# Patient Record
Sex: Female | Born: 1995 | Race: White | Hispanic: No | Marital: Married | Smoking: Never smoker
Health system: Southern US, Community
[De-identification: ages and names within clinical notes are randomized; demographics above are authoritative.]

## PROBLEM LIST (undated history)

## (undated) ENCOUNTER — Inpatient Hospital Stay (HOSPITAL_COMMUNITY): Payer: Self-pay

## (undated) DIAGNOSIS — E0590 Thyrotoxicosis, unspecified without thyrotoxic crisis or storm: Secondary | ICD-10-CM

## (undated) DIAGNOSIS — I341 Nonrheumatic mitral (valve) prolapse: Secondary | ICD-10-CM

## (undated) DIAGNOSIS — M419 Scoliosis, unspecified: Secondary | ICD-10-CM

## (undated) DIAGNOSIS — K219 Gastro-esophageal reflux disease without esophagitis: Secondary | ICD-10-CM

## (undated) HISTORY — PX: TONSILLECTOMY: SUR1361

## (undated) HISTORY — DX: Endocrine, nutritional and metabolic diseases complicating pregnancy, third trimester: E05.90

---
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
## 2014-03-26 LAB — LIPID PANEL
Cholesterol: 192 (ref 0–200)
HDL: 79 — AB (ref 35–70)
LDL Cholesterol: 94
Triglycerides: 97 (ref 40–160)

## 2018-05-08 LAB — OB RESULTS CONSOLE HEPATITIS B SURFACE ANTIGEN: Hepatitis B Surface Ag: NEGATIVE

## 2018-05-08 LAB — OB RESULTS CONSOLE RPR: RPR: NONREACTIVE

## 2018-05-08 LAB — OB RESULTS CONSOLE ABO/RH: RH Type: POSITIVE

## 2018-05-08 LAB — OB RESULTS CONSOLE ANTIBODY SCREEN: Antibody Screen: NEGATIVE

## 2018-05-08 LAB — OB RESULTS CONSOLE GC/CHLAMYDIA
Chlamydia: NEGATIVE
Gonorrhea: NEGATIVE

## 2018-05-08 LAB — OB RESULTS CONSOLE RUBELLA ANTIBODY, IGM: Rubella: IMMUNE

## 2018-05-08 LAB — OB RESULTS CONSOLE HIV ANTIBODY (ROUTINE TESTING): HIV: NONREACTIVE

## 2018-07-05 NOTE — L&D Delivery Note (Signed)
Delivery Note Pt progressed to complete and pushed well.  At 10:56 PM a viable female was delivered via Vaginal, Spontaneous (Presentation: vtx; LOA ).  APGAR: 8, 9; weight pending.   Placenta status: spontaneous, intact.  Cord:  with the following complications: nuchal x 1 reduced.   Anesthesia:  Epidural, local Episiotomy: None Lacerations: 2nd degree;Perineal;Vaginal Suture Repair: 3.0 vicryl rapide Est. Blood Loss (mL):  875  Mom to postpartum.  Baby to Couplet care / Skin to Skin.  She had more bleeding than usual, responded to uterine massage and IM Methergine  Blane Ohara Aleese Kamps 12/04/2018, 11:39 PM

## 2018-07-13 DIAGNOSIS — Z3689 Encounter for other specified antenatal screening: Secondary | ICD-10-CM | POA: Diagnosis not present

## 2018-07-13 DIAGNOSIS — Z3A19 19 weeks gestation of pregnancy: Secondary | ICD-10-CM | POA: Diagnosis not present

## 2018-08-16 ENCOUNTER — Encounter (HOSPITAL_COMMUNITY): Payer: Self-pay | Admitting: *Deleted

## 2018-08-16 ENCOUNTER — Inpatient Hospital Stay (HOSPITAL_COMMUNITY)
Admission: AD | Admit: 2018-08-16 | Discharge: 2018-08-16 | Disposition: A | Discharge status: Home / Self Care | Payer: 59 | Attending: Obstetrics and Gynecology | Admitting: Obstetrics and Gynecology

## 2018-08-16 ENCOUNTER — Other Ambulatory Visit: Payer: Self-pay

## 2018-08-16 DIAGNOSIS — O368120 Decreased fetal movements, second trimester, not applicable or unspecified: Secondary | ICD-10-CM | POA: Diagnosis not present

## 2018-08-16 DIAGNOSIS — Z3A24 24 weeks gestation of pregnancy: Secondary | ICD-10-CM | POA: Insufficient documentation

## 2018-08-16 HISTORY — DX: Scoliosis, unspecified: M41.9

## 2018-08-16 HISTORY — DX: Nonrheumatic mitral (valve) prolapse: I34.1

## 2018-08-16 NOTE — Discharge Instructions (Signed)

## 2018-08-16 NOTE — MAU Note (Addendum)
Pt states she is feeling baby move now and she feels reassured. Pt has a appt on Friday.

## 2018-08-16 NOTE — MAU Note (Signed)
Pt presents to MAU c/o DFM pt reports no FM pt was unsure if she felt the baby move or not around 2200 but nothing since then. Pt drank sprite and layed on her side and still felt no movement. Pt denies bleeding or LOF. Pt reports pain up under her ribs on her right side but it comes and goes.

## 2018-08-16 NOTE — MAU Provider Note (Signed)
Chief Complaint:  Decreased Fetal Movement   First Provider Initiated Contact with Patient 08/16/18 0239      HPI: Melissa Ashley is a 23 y.o. at [redacted]w[redacted]d who presents to maternity admissions reporting no fetal movement since 2200hrs.  Has tried all the interventions and baby is still not felt moving.   She denies LOF, vaginal bleeding, vaginal itching/burning, urinary symptoms, h/a, dizziness, n/v, diarrhea, constipation or fever/chills.  .  RN Note: Pt presents to MAU c/o DFM pt reports no FM pt was unsure if she felt the baby move or not around 2200 but nothing since then. Pt drank sprite and layed on her side and still felt no movement. Pt denies bleeding or LOF. Pt reports pain up under her ribs on her right side but it comes and goes.   Past Medical History: No past medical history on file.  Past obstetric history: OB History  No obstetric history on file.    Past Surgical History: Noncontributory  Family History: No family history on file.  Social History: Social History   Tobacco Use  . Smoking status: Not on file  Substance Use Topics  . Alcohol use: Not on file  . Drug use: Not on file  Works at [employer]  Allergies: Allergies not on file  Meds:  No medications prior to admission.    I have reviewed patient's Past Medical Hx, Surgical Hx, Family Hx, Social Hx, medications and allergies.   ROS:  Review of Systems  Constitutional: Negative for chills and fever.  Respiratory: Negative for shortness of breath.   Gastrointestinal: Negative for abdominal pain.  Genitourinary: Negative for vaginal bleeding.   Other systems negative  Physical Exam  No data found. Constitutional: Well-developed, well-nourished female in no acute distress.  Cardiovascular: normal rate and rhythm Respiratory: normal effort, clear to auscultation bilaterally GI: Abd soft, non-tender, gravid appropriate for gestational age.   No rebound or guarding. MS: Extremities  nontender, no edema, normal ROM Neurologic: Alert and oriented x 4.  GU: Neg CVAT.  PELVIC EXAM: deferred  FHT:  Baseline 140 , moderate variability, small accelerations present, no decelerations, reassuring for fetal age    Contractions: none   Labs: No results found for this or any previous visit (from the past 24 hour(s)).     Imaging:  No results found.  MAU Course/MDM: NST reviewed  We monitored for an hour for reassurance. Patient reported she did feel fetal movement throughout monitoring period  Treatments in MAU included EFM.    Assessment: Single intrauterine pregnancy at [redacted]w[redacted]d Decreased fetal movement Reassuring fetal monitoring tracing  Plan: Discharge home Preterm Labor precautions and fetal kick counts Follow up in Office for prenatal visits and recheck of status  Encouraged to return here or to other Urgent Care/ED if she develops worsening of symptoms, increase in pain, fever, or other concerning symptoms.   Pt stable at time of discharge.  Hansel Feinstein CNM, MSN Certified Nurse-Midwife 08/16/2018 2:39 AM

## 2018-08-18 DIAGNOSIS — O4402 Placenta previa specified as without hemorrhage, second trimester: Secondary | ICD-10-CM | POA: Diagnosis not present

## 2018-08-18 DIAGNOSIS — Z3A24 24 weeks gestation of pregnancy: Secondary | ICD-10-CM | POA: Diagnosis not present

## 2018-08-18 DIAGNOSIS — Z362 Encounter for other antenatal screening follow-up: Secondary | ICD-10-CM | POA: Diagnosis not present

## 2018-09-08 DIAGNOSIS — Z3689 Encounter for other specified antenatal screening: Secondary | ICD-10-CM | POA: Diagnosis not present

## 2018-09-08 DIAGNOSIS — Z3A27 27 weeks gestation of pregnancy: Secondary | ICD-10-CM | POA: Diagnosis not present

## 2018-09-08 DIAGNOSIS — Z23 Encounter for immunization: Secondary | ICD-10-CM | POA: Diagnosis not present

## 2018-09-08 DIAGNOSIS — Z3402 Encounter for supervision of normal first pregnancy, second trimester: Secondary | ICD-10-CM | POA: Diagnosis not present

## 2018-10-24 ENCOUNTER — Encounter (HOSPITAL_COMMUNITY): Payer: Self-pay | Admitting: *Deleted

## 2018-10-24 ENCOUNTER — Other Ambulatory Visit: Payer: Self-pay

## 2018-10-24 ENCOUNTER — Inpatient Hospital Stay (HOSPITAL_COMMUNITY)
Admission: AD | Admit: 2018-10-24 | Discharge: 2018-10-24 | Disposition: A | Discharge status: Home / Self Care | Payer: 59 | Attending: Obstetrics and Gynecology | Admitting: Obstetrics and Gynecology

## 2018-10-24 DIAGNOSIS — O26893 Other specified pregnancy related conditions, third trimester: Secondary | ICD-10-CM | POA: Insufficient documentation

## 2018-10-24 DIAGNOSIS — I341 Nonrheumatic mitral (valve) prolapse: Secondary | ICD-10-CM | POA: Insufficient documentation

## 2018-10-24 DIAGNOSIS — O9989 Other specified diseases and conditions complicating pregnancy, childbirth and the puerperium: Secondary | ICD-10-CM | POA: Diagnosis not present

## 2018-10-24 DIAGNOSIS — H43393 Other vitreous opacities, bilateral: Secondary | ICD-10-CM

## 2018-10-24 DIAGNOSIS — R51 Headache: Secondary | ICD-10-CM | POA: Diagnosis not present

## 2018-10-24 DIAGNOSIS — Z79899 Other long term (current) drug therapy: Secondary | ICD-10-CM | POA: Insufficient documentation

## 2018-10-24 DIAGNOSIS — R519 Headache, unspecified: Secondary | ICD-10-CM

## 2018-10-24 DIAGNOSIS — M419 Scoliosis, unspecified: Secondary | ICD-10-CM | POA: Diagnosis not present

## 2018-10-24 DIAGNOSIS — Z3A34 34 weeks gestation of pregnancy: Secondary | ICD-10-CM | POA: Insufficient documentation

## 2018-10-24 DIAGNOSIS — Z882 Allergy status to sulfonamides status: Secondary | ICD-10-CM | POA: Diagnosis not present

## 2018-10-24 DIAGNOSIS — R0989 Other specified symptoms and signs involving the circulatory and respiratory systems: Secondary | ICD-10-CM | POA: Diagnosis not present

## 2018-10-24 DIAGNOSIS — R002 Palpitations: Secondary | ICD-10-CM

## 2018-10-24 LAB — COMPREHENSIVE METABOLIC PANEL
ALT: 12 U/L (ref 0–44)
AST: 15 U/L (ref 15–41)
Albumin: 2.5 g/dL — ABNORMAL LOW (ref 3.5–5.0)
Alkaline Phosphatase: 102 U/L (ref 38–126)
Anion gap: 9 (ref 5–15)
BUN: 6 mg/dL (ref 6–20)
CO2: 21 mmol/L — ABNORMAL LOW (ref 22–32)
Calcium: 8.5 mg/dL — ABNORMAL LOW (ref 8.9–10.3)
Chloride: 106 mmol/L (ref 98–111)
Creatinine, Ser: 0.49 mg/dL (ref 0.44–1.00)
GFR calc Af Amer: >60 mL/min (ref >60)
GFR calc non Af Amer: >60 mL/min (ref >60)
Glucose, Bld: 108 mg/dL — ABNORMAL HIGH (ref 70–99)
Potassium: 3 mmol/L — ABNORMAL LOW (ref 3.5–5.1)
Sodium: 136 mmol/L (ref 135–145)
Total Bilirubin: 0.8 mg/dL (ref 0.3–1.2)
Total Protein: 5.7 g/dL — ABNORMAL LOW (ref 6.5–8.1)

## 2018-10-24 LAB — URINALYSIS, ROUTINE W REFLEX MICROSCOPIC
Bilirubin Urine: NEGATIVE
Glucose, UA: NEGATIVE mg/dL
Hgb urine dipstick: NEGATIVE
Ketones, ur: 80 mg/dL — AB
Leukocytes,Ua: NEGATIVE
Nitrite: NEGATIVE
Protein, ur: NEGATIVE mg/dL
Specific Gravity, Urine: 1.01 (ref 1.005–1.030)
pH: 7 (ref 5.0–8.0)

## 2018-10-24 LAB — PROTEIN / CREATININE RATIO, URINE
Creatinine, Urine: 58.32 mg/dL
Protein Creatinine Ratio: 0.19 mg/mg{Cre} — ABNORMAL HIGH (ref 0.00–0.15)
Total Protein, Urine: 11 mg/dL

## 2018-10-24 LAB — CBC
HCT: 32.2 % — ABNORMAL LOW (ref 36.0–46.0)
Hemoglobin: 11.1 g/dL — ABNORMAL LOW (ref 12.0–15.0)
MCH: 30.2 pg (ref 26.0–34.0)
MCHC: 34.5 g/dL (ref 30.0–36.0)
MCV: 87.7 fL (ref 80.0–100.0)
Platelets: 228 10*3/uL (ref 150–400)
RBC: 3.67 MIL/uL — ABNORMAL LOW (ref 3.87–5.11)
RDW: 13.6 % (ref 11.5–15.5)
WBC: 14.7 10*3/uL — ABNORMAL HIGH (ref 4.0–10.5)
nRBC: 0 % (ref 0.0–0.2)

## 2018-10-24 MED ORDER — BUTALBITAL-APAP-CAFFEINE 50-325-40 MG PO CAPS: 1.0000 | ORAL_CAPSULE | Freq: Four times a day (QID) | ORAL | 3 refills | Status: DC | PRN

## 2018-10-24 NOTE — Discharge Instructions (Signed)
Third Trimester of Pregnancy The third trimester is from week 28 through week 40 (months 7 through 9). The third trimester is a time when the unborn baby (fetus) is growing rapidly. At the end of the ninth month, the fetus is about 20 inches in length and weighs 6-10 pounds. Body changes during your third trimester Your body will continue to go through many changes during pregnancy. The changes vary from woman to woman. During the third trimester:  Your weight will continue to increase. You can expect to gain 25-35 pounds (11-16 kg) by the end of the pregnancy.  You may begin to get stretch marks on your hips, abdomen, and breasts.  You may urinate more often because the fetus is moving lower into your pelvis and pressing on your bladder.  You may develop or continue to have heartburn. This is caused by increased hormones that slow down muscles in the digestive tract.  You may develop or continue to have constipation because increased hormones slow digestion and cause the muscles that push waste through your intestines to relax.  You may develop hemorrhoids. These are swollen veins (varicose veins) in the rectum that can itch or be painful.  You may develop swollen, bulging veins (varicose veins) in your legs.  You may have increased body aches in the pelvis, back, or thighs. This is due to weight gain and increased hormones that are relaxing your joints.  You may have changes in your hair. These can include thickening of your hair, rapid growth, and changes in texture. Some women also have hair loss during or after pregnancy, or hair that feels dry or thin. Your hair will most likely return to normal after your baby is born.  Your breasts will continue to grow and they will continue to become tender. A yellow fluid (colostrum) may leak from your breasts. This is the first milk you are producing for your baby.  Your belly button may stick out.  You may notice more swelling in your hands,  face, or ankles.  You may have increased tingling or numbness in your hands, arms, and legs. The skin on your belly may also feel numb.  You may feel short of breath because of your expanding uterus.  You may have more problems sleeping. This can be caused by the size of your belly, increased need to urinate, and an increase in your body's metabolism.  You may notice the fetus "dropping," or moving lower in your abdomen (lightening).  You may have increased vaginal discharge.  You may notice your joints feel loose and you may have pain around your pelvic bone. What to expect at prenatal visits You will have prenatal exams every 2 weeks until week 36. Then you will have weekly prenatal exams. During a routine prenatal visit:  You will be weighed to make sure you and the baby are growing normally.  Your blood pressure will be taken.  Your abdomen will be measured to track your baby's growth.  The fetal heartbeat will be listened to.  Any test results from the previous visit will be discussed.  You may have a cervical check near your due date to see if your cervix has softened or thinned (effaced).  You will be tested for Group B streptococcus. This happens between 35 and 37 weeks. Your health care provider may ask you:  What your birth plan is.  How you are feeling.  If you are feeling the baby move.  If you have had any abnormal  symptoms, such as leaking fluid, bleeding, severe headaches, or abdominal cramping.  If you are using any tobacco products, including cigarettes, chewing tobacco, and electronic cigarettes.  If you have any questions. Other tests or screenings that may be performed during your third trimester include:  Blood tests that check for low iron levels (anemia).  Fetal testing to check the health, activity level, and growth of the fetus. Testing is done if you have certain medical conditions or if there are problems during the pregnancy.  Nonstress test  (NST). This test checks the health of your baby to make sure there are no signs of problems, such as the baby not getting enough oxygen. During this test, a belt is placed around your belly. The baby is made to move, and its heart rate is monitored during movement. What is false labor? False labor is a condition in which you feel small, irregular tightenings of the muscles in the womb (contractions) that usually go away with rest, changing position, or drinking water. These are called Braxton Hicks contractions. Contractions may last for hours, days, or even weeks before true labor sets in. If contractions come at regular intervals, become more frequent, increase in intensity, or become painful, you should see your health care provider. What are the signs of labor?  Abdominal cramps.  Regular contractions that start at 10 minutes apart and become stronger and more frequent with time.  Contractions that start on the top of the uterus and spread down to the lower abdomen and back.  Increased pelvic pressure and dull back pain.  A watery or bloody mucus discharge that comes from the vagina.  Leaking of amniotic fluid. This is also known as your "water breaking." It could be a slow trickle or a gush. Let your health care provider know if it has a color or strange odor. If you have any of these signs, call your health care provider right away, even if it is before your due date. Follow these instructions at home: Medicines  Follow your health care provider's instructions regarding medicine use. Specific medicines may be either safe or unsafe to take during pregnancy.  Take a prenatal vitamin that contains at least 600 micrograms (mcg) of folic acid.  If you develop constipation, try taking a stool softener if your health care provider approves. Eating and drinking   Eat a balanced diet that includes fresh fruits and vegetables, whole grains, good sources of protein such as meat, eggs, or tofu,  and low-fat dairy. Your health care provider will help you determine the amount of weight gain that is right for you.  Avoid raw meat and uncooked cheese. These carry germs that can cause birth defects in the baby.  If you have low calcium intake from food, talk to your health care provider about whether you should take a daily calcium supplement.  Eat four or five small meals rather than three large meals a day.  Limit foods that are high in fat and processed sugars, such as fried and sweet foods.  To prevent constipation: ? Drink enough fluid to keep your urine clear or pale yellow. ? Eat foods that are high in fiber, such as fresh fruits and vegetables, whole grains, and beans. Activity  Exercise only as directed by your health care provider. Most women can continue their usual exercise routine during pregnancy. Try to exercise for 30 minutes at least 5 days a week. Stop exercising if you experience uterine contractions.  Avoid heavy lifting.  Do  not exercise in extreme heat or humidity, or at high altitudes.  Wear low-heel, comfortable shoes.  Practice good posture.  You may continue to have sex unless your health care provider tells you otherwise. Relieving pain and discomfort  Take frequent breaks and rest with your legs elevated if you have leg cramps or low back pain.  Take warm sitz baths to soothe any pain or discomfort caused by hemorrhoids. Use hemorrhoid cream if your health care provider approves.  Wear a good support bra to prevent discomfort from breast tenderness.  If you develop varicose veins: ? Wear support pantyhose or compression stockings as told by your healthcare provider. ? Elevate your feet for 15 minutes, 3-4 times a day. Prenatal care  Write down your questions. Take them to your prenatal visits.  Keep all your prenatal visits as told by your health care provider. This is important. Safety  Wear your seat belt at all times when driving.  Make  a list of emergency phone numbers, including numbers for family, friends, the hospital, and police and fire departments. General instructions  Avoid cat litter boxes and soil used by cats. These carry germs that can cause birth defects in the baby. If you have a cat, ask someone to clean the litter box for you.  Do not travel far distances unless it is absolutely necessary and only with the approval of your health care provider.  Do not use hot tubs, steam rooms, or saunas.  Do not drink alcohol.  Do not use any products that contain nicotine or tobacco, such as cigarettes and e-cigarettes. If you need help quitting, ask your health care provider.  Do not use any medicinal herbs or unprescribed drugs. These chemicals affect the formation and growth of the baby.  Do not douche or use tampons or scented sanitary pads.  Do not cross your legs for long periods of time.  To prepare for the arrival of your baby: ? Take prenatal classes to understand, practice, and ask questions about labor and delivery. ? Make a trial run to the hospital. ? Visit the hospital and tour the maternity area. ? Arrange for maternity or paternity leave through employers. ? Arrange for family and friends to take care of pets while you are in the hospital. ? Purchase a rear-facing car seat and make sure you know how to install it in your car. ? Pack your hospital bag. ? Prepare the babys nursery. Make sure to remove all pillows and stuffed animals from the baby's crib to prevent suffocation.  Visit your dentist if you have not gone during your pregnancy. Use a soft toothbrush to brush your teeth and be gentle when you floss. Contact a health care provider if:  You are unsure if you are in labor or if your water has broken.  You become dizzy.  You have mild pelvic cramps, pelvic pressure, or nagging pain in your abdominal area.  You have lower back pain.  You have persistent nausea, vomiting, or  diarrhea.  You have an unusual or bad smelling vaginal discharge.  You have pain when you urinate. Get help right away if:  Your water breaks before 37 weeks.  You have regular contractions less than 5 minutes apart before 37 weeks.  You have a fever.  You are leaking fluid from your vagina.  You have spotting or bleeding from your vagina.  You have severe abdominal pain or cramping.  You have rapid weight loss or weight gain.  You have  shortness of breath with chest pain.  You notice sudden or extreme swelling of your face, hands, ankles, feet, or legs.  Your baby makes fewer than 10 movements in 2 hours.  You have severe headaches that do not go away when you take medicine.  You have vision changes. Summary  The third trimester is from week 28 through week 40, months 7 through 9. The third trimester is a time when the unborn baby (fetus) is growing rapidly.  During the third trimester, your discomfort may increase as you and your baby continue to gain weight. You may have abdominal, leg, and back pain, sleeping problems, and an increased need to urinate.  During the third trimester your breasts will keep growing and they will continue to become tender. A yellow fluid (colostrum) may leak from your breasts. This is the first milk you are producing for your baby.  False labor is a condition in which you feel small, irregular tightenings of the muscles in the womb (contractions) that eventually go away. These are called Braxton Hicks contractions. Contractions may last for hours, days, or even weeks before true labor sets in.  Signs of labor can include: abdominal cramps; regular contractions that start at 10 minutes apart and become stronger and more frequent with time; watery or bloody mucus discharge that comes from the vagina; increased pelvic pressure and dull back pain; and leaking of amniotic fluid. This information is not intended to replace advice given to you by your  health care provider. Make sure you discuss any questions you have with your health care provider. Document Released: 06/15/2001 Document Revised: 07/27/2016 Document Reviewed: 07/27/2016 Elsevier Interactive Patient Education  2019 [place] Headache Without Cause A headache is pain or discomfort that is felt around the head or neck area. There are many causes and types of headaches. In some cases, the cause may not be found. Follow these instructions at home: Watch your condition for any changes. Let your doctor know about them. Take these steps to help with your condition: Managing pain      Take over-the-counter and prescription medicines only as told by your doctor.  Lie down in a dark, quiet room when you have a headache.  If told, put ice on your head and neck area: ? Put ice in a plastic bag. ? Place a towel between your skin and the bag. ? Leave the ice on for 20 minutes, 2-3 times per day.  If told, put heat on the affected area. Use the heat source that your doctor recommends, such as a moist heat pack or a heating pad. ? Place a towel between your skin and the heat source. ? Leave the heat on for 20-30 minutes. ? Remove the heat if your skin turns bright red. This is very important if you are unable to feel pain, heat, or cold. You may have a greater risk of getting burned.  Keep lights dim if bright lights bother you or make your headaches worse. Eating and drinking  Eat meals on a regular schedule.  If you drink alcohol: ? Limit how much you use to:  0-1 drink a day for women.  0-2 drinks a day for men. ? Be aware of how much alcohol is in your drink. In the [place] one drink equals one 12 oz bottle of beer (355 mL), one 5 oz glass of wine (148 mL), or one 1 oz glass of hard liquor (44 mL).  Stop drinking caffeine, or reduce how  much caffeine you drink. General instructions   Keep a journal to find out if certain things bring on headaches. For  example, write down: ? What you eat and drink. ? How much sleep you get. ? Any change to your diet or medicines.  Get a massage or try other ways to relax.  Limit stress.  Sit up straight. Do not tighten (tense) your muscles.  Do not use any products that contain nicotine or tobacco. This includes cigarettes, e-cigarettes, and chewing tobacco. If you need help quitting, ask your doctor.  Exercise regularly as told by your doctor.  Get enough sleep. This often means 7-9 hours of sleep each night.  Keep all follow-up visits as told by your doctor. This is important. Contact a doctor if:  Your symptoms are not helped by medicine.  You have a headache that feels different than the other headaches.  You feel sick to your stomach (nauseous) or you throw up (vomit).  You have a fever. Get help right away if:  Your headache gets very bad quickly.  Your headache gets worse after a lot of physical activity.  You keep throwing up.  You have a stiff neck.  You have trouble seeing.  You have trouble speaking.  You have pain in the eye or ear.  Your muscles are weak or you lose muscle control.  You lose your balance or have trouble walking.  You feel like you will pass out (faint) or you pass out.  You are mixed up (confused).  You have a seizure. Summary  A headache is pain or discomfort that is felt around the head or neck area.  There are many causes and types of headaches. In some cases, the cause may not be found.  Keep a journal to help find out what causes your headaches. Watch your condition for any changes. Let your doctor know about them.  Contact a doctor if you have a headache that is different from usual, or if your headache is not helped by medicine.  Get help right away if your headache gets very bad, you throw up, you have trouble seeing, you lose your balance, or you have a seizure. This information is not intended to replace advice given to you by  your health care provider. Make sure you discuss any questions you have with your health care provider. Document Released: 03/30/2008 Document Revised: 01/09/2018 Document Reviewed: 01/09/2018 Elsevier Interactive Patient Education  2019 [employer] Sinus Tachycardia  Sinus tachycardia is a kind of fast heartbeat. In sinus tachycardia, the heart beats more than 100 times a minute. Sinus tachycardia starts in a part of the heart called the sinus node. Sinus tachycardia may be harmless, or it may be a sign of a serious condition. What are the causes? This condition may be caused by:  Exercise or exertion.  A fever.  Pain.  Loss of body fluids (dehydration).  Severe bleeding (hemorrhage).  Anxiety and stress.  Certain substances, including: ? Alcohol. ? Caffeine. ? Tobacco and nicotine products. ? Cold medicines. ? Illegal drugs.  Medical conditions including: ? Heart disease. ? An infection. ? An overactive thyroid (hyperthyroidism). ? A lack of red blood cells (anemia). What are the signs or symptoms? Symptoms of this condition include:  A feeling that the heart is beating quickly (palpitations).  Suddenly noticing your heartbeat (cardiac awareness).  Dizziness.  Tiredness (fatigue).  Shortness of breath.  Chest pain.  Nausea.  Fainting. How is this diagnosed? This condition is diagnosed  with:  A physical exam.  Other tests, such as: ? Blood tests. ? An electrocardiogram (ECG). This test measures the electrical activity of the heart. ? Ambulatory cardiac monitor. This records your heartbeats for 24 hours or more. You may be referred to a heart specialist (cardiologist). How is this treated? Treatment for this condition depends on the cause or the underlying condition. Treatment may involve:  Treating the underlying condition.  Taking new medicines or changing your current medicines as told by your health care provider.  Making changes to your diet  or lifestyle. Follow these instructions at home: Lifestyle   Do not use any products that contain nicotine or tobacco, such as cigarettes and e-cigarettes. If you need help quitting, ask your health care provider.  Do not use illegal drugs, such as cocaine.  Learn relaxation methods to help you when you get stressed or anxious. These include deep breathing.  Avoid caffeine or other stimulants. Alcohol use   Do not drink alcohol if: ? Your health care provider tells you not to drink. ? You are pregnant, may be pregnant, or are planning to become pregnant.  If you drink alcohol, limit how much you have: ? 0-1 drink a day for women. ? 0-2 drinks a day for men.  Be aware of how much alcohol is in your drink. In the [place] one drink equals one typical bottle of beer (12 oz), one-half glass of wine (5 oz), or one shot of hard liquor (1 oz). General instructions  Drink enough fluids to keep your urine pale yellow.  Take over-the-counter and prescription medicines only as told by your health care provider.  Keep all follow-up visits as told by your health care provider. This is important. Contact a health care provider if you have:  A fever.  Vomiting or diarrhea that does not go away. Get help right away if you:  Have pain in your chest, upper arms, jaw, or neck.  Become weak or dizzy.  Feel faint.  Have palpitations that do not go away. Summary  In sinus tachycardia, the heart beats more than 100 times a minute.  Sinus tachycardia may be harmless, or it may be a sign of a serious condition.  Treatment for this condition depends on the cause or the underlying condition.  Get help right away if you have pain in your chest, upper arms, jaw, or neck. This information is not intended to replace advice given to you by your health care provider. Make sure you discuss any questions you have with your health care provider. Document Released: 07/29/2004 Document Revised:  08/10/2017 Document Reviewed: 08/10/2017 Elsevier Interactive Patient Education  2019 [employer].

## 2018-10-24 NOTE — MAU Note (Addendum)
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   HAS CARDIAC HX- HAS LITTLE HOLE IN HEART.    H/A STARTED AT  3PM- TIRED- NOT SLEEPING GOOD-   CALLED DR Terri Piedra AT 6 PM- TOOK TYLENOL- 650 MG  AND ATE.  NOW HER HEAD FEELS WORSE AND BP WAS SAME AT HOME..  IN LOBBY - SAW FLOATERS.       IN OFFICE - BP WAS  OK.  DENIES EPIGASTRIC PAIN .

## 2018-10-24 NOTE — MAU Provider Note (Addendum)
Chief Complaint:  Headache   First Provider Initiated Contact with Patient 10/24/18 2007      HPI: Melissa Ashley is a 23 y.o. G1P0 at 65w0dwho presents to maternity admissions reporting "heart beating out of my chest" at home (better now), elevated BP on home cuff, and intermittent feelings of SOB.  No cough or fever.  Intermittently sees floaters.   Has MV Prolapse and "hole in my heart" but has not seen a cardiologist in 2 years.. She reports good fetal movement, denies LOF, vaginal bleeding, vaginal itching/burning, urinary symptoms, h/a, dizziness, n/v, diarrhea, constipation or fever/chills.  She denies  RUQ abdominal pain.  RN note: PT SAYS WAS GETTING READY FOR WORK- HERE ON MOTHER  BABY- RN - CALLED OUT TONIGHT  -  WAS SOB AND INCREASE HR- SO SAT ON  SOFA  AND TOOK BP - 152/88.   HAS CARDIAC HX- HAS LITTLE HOLE IN HEART.    H/A STARTED AT  3PM- TIRED- NOT SLEEPING GOOD-   CALLED DR Terri Piedra AT 6 PM- TOOK TYLENOL- 650 MG  AND ATE.  NOW HER HEAD FEELS WORSE AND BP WAS SAME AT HOME..  IN LOBBY - SAW FLOATERS.       IN OFFICE - BP WAS  OK.  DENIES EPIGASTRIC PAIN .  Past Medical History: Past Medical History:  Diagnosis Date  . Mitral valve prolapse    asymptomatic  . Scoliosis     Past obstetric history: OB History  Gravida Para Term Preterm AB Living  1            SAB TAB Ectopic Multiple Live Births               # Outcome Date GA Lbr Len/2nd Weight Sex Delivery Anes PTL Lv  1 Current             Past Surgical History: Past Surgical History:  Procedure Laterality Date  . TONSILLECTOMY      Family History: Family History  Problem Relation Age of Onset  . Hypertension Father   . Cancer Maternal Grandmother     Social History: Social History   Tobacco Use  . Smoking status: Never Smoker  Substance Use Topics  . Alcohol use: Never    Frequency: Never  . Drug use: Never    Allergies:  Allergies  Allergen Reactions  . Neomycin   . Shellfish Allergy   . Sulfa  Antibiotics Swelling    Meds:  Medications Prior to Admission  Medication Sig Dispense Refill Last Dose  . docusate sodium (COLACE) 100 MG capsule Take 100 mg by mouth 2 (two) times daily.   10/23/2018 at Unknown time  . Prenatal Vit-Fe Fumarate-FA (PRENATAL MULTIVITAMIN) TABS tablet Take 1 tablet by mouth daily at 12 noon.   10/23/2018 at Unknown time    I have reviewed patient's Past Medical Hx, Surgical Hx, Family Hx, Social Hx, medications and allergies.   ROS:  Review of Systems  Constitutional: Negative for chills and fever.  HENT: Negative for sore throat.   Eyes: Positive for visual disturbance (seeing floaters at times).  Respiratory: Positive for shortness of breath (had one episode, none now). Negative for cough and wheezing.   Cardiovascular: Negative for chest pain and leg swelling.  Gastrointestinal: Negative for abdominal pain, constipation, diarrhea, nausea and vomiting.  Genitourinary: Negative for vaginal bleeding.  Neurological: Negative for weakness.   Other systems negative  Physical Exam   Patient Vitals for the past 24 hrs:  BP  Temp Temp src Pulse Resp Height Weight  10/24/18 1947 136/80 98.5 F (36.9 C) Oral (!) 108 20 5\' 4"  (1.626 m) 86.8 kg   Vitals:   10/24/18 2045 10/24/18 2100 10/24/18 2115 10/24/18 2301  BP: 113/67 117/63 115/68 122/78  Pulse: 99 92 (!) 101 88  Resp:    17  Temp:      TempSrc:      Weight:      Height:        Constitutional: Well-developed, well-nourished female in no acute distress.  Cardiovascular: normal rate and rhythm, no tachycardia Respiratory: normal effort, clear to auscultation bilaterally GI: Abd soft, non-tender, gravid appropriate for gestational age.   MS: Extremities nontender, Trace pedal edema, normal ROM Neurologic: Alert and oriented x 4.  DTRs 1+ with no clonus GU: Neg CVAT.  PELVIC EXAM:  deferred  FHT:  Baseline 140 , moderate variability, accelerations present, no decelerations Contractions:   Rare   Labs: Results for orders placed or performed during the hospital encounter of 10/24/18 (from the past 24 hour(s))  Urinalysis, Routine w reflex microscopic     Status: Abnormal   Collection Time: 10/24/18  7:56 PM  Result Value Ref Range   Color, Urine YELLOW YELLOW   APPearance CLEAR CLEAR   Specific Gravity, Urine 1.010 1.005 - 1.030   pH 7.0 5.0 - 8.0   Glucose, UA NEGATIVE NEGATIVE mg/dL   Hgb urine dipstick NEGATIVE NEGATIVE   Bilirubin Urine NEGATIVE NEGATIVE   Ketones, ur 80 (A) NEGATIVE mg/dL   Protein, ur NEGATIVE NEGATIVE mg/dL   Nitrite NEGATIVE NEGATIVE   Leukocytes,Ua NEGATIVE NEGATIVE  CBC     Status: Abnormal   Collection Time: 10/24/18  8:19 PM  Result Value Ref Range   WBC 14.7 (H) 4.0 - 10.5 K/uL   RBC 3.67 (L) 3.87 - 5.11 MIL/uL   Hemoglobin 11.1 (L) 12.0 - 15.0 g/dL   HCT 32.2 (L) 36.0 - 46.0 %   MCV 87.7 80.0 - 100.0 fL   MCH 30.2 26.0 - 34.0 pg   MCHC 34.5 30.0 - 36.0 g/dL   RDW 13.6 11.5 - 15.5 %   Platelets 228 150 - 400 K/uL   nRBC 0.0 0.0 - 0.2 %  Comprehensive metabolic panel     Status: Abnormal   Collection Time: 10/24/18  8:19 PM  Result Value Ref Range   Sodium 136 135 - 145 mmol/L   Potassium 3.0 (L) 3.5 - 5.1 mmol/L   Chloride 106 98 - 111 mmol/L   CO2 21 (L) 22 - 32 mmol/L   Glucose, Bld 108 (H) 70 - 99 mg/dL   BUN 6 6 - 20 mg/dL   Creatinine, Ser 0.49 0.44 - 1.00 mg/dL   Calcium 8.5 (L) 8.9 - 10.3 mg/dL   Total Protein 5.7 (L) 6.5 - 8.1 g/dL   Albumin 2.5 (L) 3.5 - 5.0 g/dL   AST 15 15 - 41 U/L   ALT 12 0 - 44 U/L   Alkaline Phosphatase 102 38 - 126 U/L   Total Bilirubin 0.8 0.3 - 1.2 mg/dL   GFR calc non Af Amer >60 >60 mL/min   GFR calc Af Amer >60 >60 mL/min   Anion gap 9 5 - 15  Protein / creatinine ratio, urine     Status: Abnormal   Collection Time: 10/24/18  8:25 PM  Result Value Ref Range   Creatinine, Urine 58.32 mg/dL   Total Protein, Urine 11 mg/dL   Protein Creatinine Ratio  0.19 (H) 0.00 - 0.15 mg/mg[Cre]       Imaging:  12 lead EKG done Read by Dr Nehemiah Settle NSR with machine interpreted LA enlargement and LVH WNL per Dr Nehemiah Settle  MAU Course/MDM: I have ordered labs and reviewed results. No evidence of preeclampsia Blood pressures all within normal limits Pt currently not experiencing pounding heart rate or SOB.  States headache is "not bad".  Declines medicine for headache NST reviewed and is reactive, Category I Will order EKG >> see above  Consult Dr Nehemiah Settle with presentation, exam findings and test results.   Assessment: Single intrauterine pregnancy at [redacted]w[redacted]d Headache, improved Reported elevation in BP at home, all normal here Normal preeclampsia labs History of MVP and ?VSD, normal EKG  Plan: Discharge home Rx Fioricet for home use for headaches if needed Discussed blood pressure readings at home may not be accurate, need to calibrate  Discussed history of heart conditions. May want to consult cardiologist for evaluation Labor precautions and fetal kick counts Follow up in Office (scheduled tomorrow) for prenatal visits and recheck  Encouraged to return here or to other Urgent Care/ED if she develops worsening of symptoms, increase in pain, fever, or other concerning symptoms.   Pt stable at time of discharge.  Hansel Feinstein CNM, MSN Certified Nurse-Midwife 10/24/2018 8:07 PM

## 2018-11-03 DIAGNOSIS — Z3A35 35 weeks gestation of pregnancy: Secondary | ICD-10-CM | POA: Diagnosis not present

## 2018-11-03 DIAGNOSIS — O133 Gestational [pregnancy-induced] hypertension without significant proteinuria, third trimester: Secondary | ICD-10-CM | POA: Diagnosis not present

## 2018-11-10 DIAGNOSIS — Z3403 Encounter for supervision of normal first pregnancy, third trimester: Secondary | ICD-10-CM | POA: Diagnosis not present

## 2018-11-10 DIAGNOSIS — Z3A36 36 weeks gestation of pregnancy: Secondary | ICD-10-CM | POA: Diagnosis not present

## 2018-11-10 DIAGNOSIS — Z113 Encounter for screening for infections with a predominantly sexual mode of transmission: Secondary | ICD-10-CM | POA: Diagnosis not present

## 2018-11-10 DIAGNOSIS — Z3685 Encounter for antenatal screening for Streptococcus B: Secondary | ICD-10-CM | POA: Diagnosis not present

## 2018-11-10 LAB — OB RESULTS CONSOLE GBS: GBS: NEGATIVE

## 2018-11-23 ENCOUNTER — Inpatient Hospital Stay (HOSPITAL_COMMUNITY)
Admission: AD | Admit: 2018-11-23 | Discharge: 2018-11-24 | Disposition: A | Discharge status: Home / Self Care | Payer: 59 | Attending: Obstetrics and Gynecology | Admitting: Obstetrics and Gynecology

## 2018-11-23 ENCOUNTER — Other Ambulatory Visit: Payer: Self-pay

## 2018-11-23 ENCOUNTER — Encounter (HOSPITAL_COMMUNITY): Payer: Self-pay | Admitting: *Deleted

## 2018-11-23 ENCOUNTER — Telehealth (HOSPITAL_COMMUNITY): Payer: Self-pay | Admitting: *Deleted

## 2018-11-23 DIAGNOSIS — W1830XA Fall on same level, unspecified, initial encounter: Secondary | ICD-10-CM

## 2018-11-23 DIAGNOSIS — Z8249 Family history of ischemic heart disease and other diseases of the circulatory system: Secondary | ICD-10-CM | POA: Diagnosis not present

## 2018-11-23 DIAGNOSIS — O9989 Other specified diseases and conditions complicating pregnancy, childbirth and the puerperium: Secondary | ICD-10-CM | POA: Diagnosis not present

## 2018-11-23 DIAGNOSIS — Z3689 Encounter for other specified antenatal screening: Secondary | ICD-10-CM | POA: Diagnosis not present

## 2018-11-23 DIAGNOSIS — W109XXA Fall (on) (from) unspecified stairs and steps, initial encounter: Secondary | ICD-10-CM | POA: Insufficient documentation

## 2018-11-23 DIAGNOSIS — Z882 Allergy status to sulfonamides status: Secondary | ICD-10-CM | POA: Insufficient documentation

## 2018-11-23 DIAGNOSIS — O9A213 Injury, poisoning and certain other consequences of external causes complicating pregnancy, third trimester: Secondary | ICD-10-CM

## 2018-11-23 DIAGNOSIS — Z91013 Allergy to seafood: Secondary | ICD-10-CM | POA: Insufficient documentation

## 2018-11-23 DIAGNOSIS — Z3A38 38 weeks gestation of pregnancy: Secondary | ICD-10-CM | POA: Insufficient documentation

## 2018-11-23 DIAGNOSIS — M7918 Myalgia, other site: Secondary | ICD-10-CM | POA: Diagnosis not present

## 2018-11-23 NOTE — MAU Provider Note (Addendum)
Patient Melissa Ashley is a 23 y.o. G1P0 At [redacted]w[redacted]d here after a fall this morning at 11:45 after she slipped at the bottom of her stairs. She denies VB, decreased fetal movments, LOF. She denies dysuria, vaginal discharge, floating spots, blurry vision, headache and RUQ. She denies contractions.   She has had uncomplicated pregnancy although patient reports she has had occasionally high blood pressure during her pregnancy. She has been getting her blood pressure checked twice a week at the office. They are "keeping a close eye on it". She is scheduled to be induced at 39 weeks 6 days (June 1).  She had an ob-gyn appt this morning with Dr. Willis Modena.  History     CSN: [id number]  Arrival date and time: 11/23/18 1307   First Provider Initiated Contact with Patient 11/23/18 1426      Chief Complaint  Patient presents with  . Fall   Fall  The accident occurred 1 to 3 hours ago. The fall occurred while walking. She fell from a height of 3 to 5 ft. She landed on carpet. There was no blood loss. The point of impact was the buttocks. The pain is present in the buttocks.  She did not hit her head or her abdomen.   OB History    Gravida  1   Para      Term      Preterm      AB      Living        SAB      TAB      Ectopic      Multiple      Live Births              Past Medical History:  Diagnosis Date  . Mitral valve prolapse    asymptomatic  . Scoliosis     Past Surgical History:  Procedure Laterality Date  . TONSILLECTOMY     hemorrhaged    Family History  Problem Relation Age of Onset  . Hypertension Father   . Cancer Maternal Grandmother     Social History   Tobacco Use  . Smoking status: Never Smoker  . Smokeless tobacco: Never Used  Substance Use Topics  . Alcohol use: Never    Frequency: Never  . Drug use: Never    Allergies:  Allergies  Allergen Reactions  . Neomycin   . Shellfish Allergy   . Sulfa Antibiotics Swelling     Medications Prior to Admission  Medication Sig Dispense Refill Last Dose  . docusate sodium (COLACE) 100 MG capsule Take 100 mg by mouth 2 (two) times daily.   Past Week at Unknown time  . Prenatal Vit-Fe Fumarate-FA (PRENATAL MULTIVITAMIN) TABS tablet Take 1 tablet by mouth daily at 12 noon.   11/23/2018 at Unknown time  . Butalbital-APAP-Caffeine 50-325-40 MG capsule Take 1-2 capsules by mouth every 6 (six) hours as needed for headache. 30 capsule 3     Review of Systems  Constitutional: Negative.   HENT: Negative.   Respiratory: Negative.   Cardiovascular: Negative.   Gastrointestinal: Negative.   Genitourinary: Negative.   Musculoskeletal:       Pain on her left buttock from where she landed  Neurological: Negative.   Psychiatric/Behavioral: Negative.    Physical Exam   Blood pressure 132/79, pulse (!) 103, temperature 98.4 F (36.9 C), resp. rate 18, height 5\' 4"  (1.626 m), weight 87.5 kg, last menstrual period 02/28/2018.  Physical Exam  Constitutional: She is  oriented to person, place, and time. She appears well-developed.  HENT:  Head: Normocephalic.  Neck: Normal range of motion.  GI: Soft.  Musculoskeletal: Normal range of motion.  Neurological: She is alert and oriented to person, place, and time.  Skin: Skin is warm and dry.  Psychiatric: She has a normal mood and affect.    MAU Course  Procedures  MDM -will monitor for 4 hours status post fall (until 3:45) -patient feels strong fetal movements while on NST -NST: 145 bpm, mod var, present acel, neg decels, no contractions.  -No bleeding, discharge or other complaints.    Assessment and Plan   1. NST (non-stress test) reactive    2. Patient stable for discharge; reviewed warning signs and when to return to the MAU.   3. Keep follow up appt next week at ob-gyn.   4. All questions answered.    Mervyn Skeeters Kooistra 11/23/2018, 2:32 PM

## 2018-11-23 NOTE — Discharge Instructions (Signed)

## 2018-11-23 NOTE — Telephone Encounter (Signed)
Preadmission screen  

## 2018-11-23 NOTE — MAU Note (Signed)
Pt stated at 11:45 she was going down stairs and missed the last 2 and fell on her back an bottom. Does not think she hit her abd. Good fetal movement since the fall. Reports some soreness in her left hip/lower back.

## 2018-11-23 NOTE — MAU Note (Signed)
Urine in lab 

## 2018-11-25 ENCOUNTER — Other Ambulatory Visit: Payer: Self-pay

## 2018-11-25 ENCOUNTER — Encounter (HOSPITAL_COMMUNITY): Payer: Self-pay

## 2018-11-25 ENCOUNTER — Inpatient Hospital Stay (HOSPITAL_COMMUNITY)
Admission: AD | Admit: 2018-11-25 | Discharge: 2018-11-25 | Disposition: A | Discharge status: Home / Self Care | Payer: 59 | Attending: Obstetrics and Gynecology | Admitting: Obstetrics and Gynecology

## 2018-11-25 DIAGNOSIS — Z3A38 38 weeks gestation of pregnancy: Secondary | ICD-10-CM | POA: Diagnosis not present

## 2018-11-25 DIAGNOSIS — Z809 Family history of malignant neoplasm, unspecified: Secondary | ICD-10-CM | POA: Insufficient documentation

## 2018-11-25 DIAGNOSIS — Z91013 Allergy to seafood: Secondary | ICD-10-CM | POA: Diagnosis not present

## 2018-11-25 DIAGNOSIS — I341 Nonrheumatic mitral (valve) prolapse: Secondary | ICD-10-CM | POA: Insufficient documentation

## 2018-11-25 DIAGNOSIS — Z8249 Family history of ischemic heart disease and other diseases of the circulatory system: Secondary | ICD-10-CM | POA: Insufficient documentation

## 2018-11-25 DIAGNOSIS — O368190 Decreased fetal movements, unspecified trimester, not applicable or unspecified: Secondary | ICD-10-CM | POA: Insufficient documentation

## 2018-11-25 DIAGNOSIS — Z882 Allergy status to sulfonamides status: Secondary | ICD-10-CM | POA: Diagnosis not present

## 2018-11-25 DIAGNOSIS — M419 Scoliosis, unspecified: Secondary | ICD-10-CM | POA: Diagnosis not present

## 2018-11-25 DIAGNOSIS — O368130 Decreased fetal movements, third trimester, not applicable or unspecified: Secondary | ICD-10-CM | POA: Diagnosis not present

## 2018-11-25 DIAGNOSIS — O9989 Other specified diseases and conditions complicating pregnancy, childbirth and the puerperium: Secondary | ICD-10-CM | POA: Insufficient documentation

## 2018-11-25 DIAGNOSIS — Z3689 Encounter for other specified antenatal screening: Secondary | ICD-10-CM | POA: Insufficient documentation

## 2018-11-25 DIAGNOSIS — Z881 Allergy status to other antibiotic agents status: Secondary | ICD-10-CM | POA: Diagnosis not present

## 2018-11-25 NOTE — MAU Note (Signed)
Melissa Ashley is a 23 y.o. at [redacted]w[redacted]d here in MAU reporting: pt states she fell on Thursday, was feeling normal movement until today. Has felt 3-4 movement since 0930. Having irregular cramping, no bleeding, no LOF  Onset of complaint: today  Pain score: 0/10  Vitals:   11/25/18 1233  BP: 130/78  Pulse: 89  Resp: 18  Temp: 98.5 F (36.9 C)  SpO2: 99%     FHT:160  Lab orders placed from triage: none

## 2018-11-25 NOTE — Discharge Instructions (Signed)

## 2018-11-25 NOTE — MAU Provider Note (Addendum)
Patient  Melissa Ashley is a 23 y.o. G1P0 At [redacted]w[redacted]d here with complaints of decreased fetal movements. She denies LOF, VB, HA, NV, abdominal pain, fever, chills, SOB.   Patient had a fall oon 5-21 for a fall down her stairs; she had a reactive NST and was evaluated and discharged from the MAU.   She receives [facility] at [facility]; she is scheduled for induction on June 1.    History     CSN: [id number]  Arrival date and time: 11/25/18 1222   First Provider Initiated Contact with Patient 11/25/18 1303      Chief Complaint  Patient presents with  . Decreased Fetal Movement   HPI Patient states that her baby moved "normal" on Friday and a lot on Friday night. When she woke up today at 9:30, she said that she felt like the baby was moving less. She tried drinking juice and lying down. She tried "poking" her belly and see if the baby would respond; usually if she jiggles her belly the baby will kick but this time she did not. Patient says that she was "trying to give the baby time to move" but then she got too worried and so she called Dr. Melba Coon at 11:30. Dr. Melba Coon advised patient to come to MAU.   OB History    Gravida  1   Para      Term      Preterm      AB      Living        SAB      TAB      Ectopic      Multiple      Live Births              Past Medical History:  Diagnosis Date  . Mitral valve prolapse    asymptomatic  . Scoliosis     Past Surgical History:  Procedure Laterality Date  . TONSILLECTOMY     hemorrhaged    Family History  Problem Relation Age of Onset  . Hypertension Father   . Cancer Maternal Grandmother     Social History   Tobacco Use  . Smoking status: Never Smoker  . Smokeless tobacco: Never Used  Substance Use Topics  . Alcohol use: Never    Frequency: Never  . Drug use: Never    Allergies:  Allergies  Allergen Reactions  . Neomycin   . Shellfish Allergy   . Sulfa Antibiotics Swelling     Medications Prior to Admission  Medication Sig Dispense Refill Last Dose  . Butalbital-APAP-Caffeine 50-325-40 MG capsule Take 1-2 capsules by mouth every 6 (six) hours as needed for headache. 30 capsule 3   . docusate sodium (COLACE) 100 MG capsule Take 100 mg by mouth 2 (two) times daily.   Past Week at Unknown time  . Prenatal Vit-Fe Fumarate-FA (PRENATAL MULTIVITAMIN) TABS tablet Take 1 tablet by mouth daily at 12 noon.   11/23/2018 at Unknown time    Review of Systems  Constitutional: Negative.   HENT: Negative.   Respiratory: Negative.   Cardiovascular: Negative.   Gastrointestinal: Negative.   Genitourinary: Negative.   Musculoskeletal: Negative.    Physical Exam   Blood pressure 130/78, pulse 89, temperature 98.5 F (36.9 C), temperature source Oral, resp. rate 18, height 5\' 4"  (1.626 m), weight 87.4 kg, last menstrual period 02/28/2018, SpO2 99 %.  Physical Exam  Constitutional: She appears well-developed and well-nourished.  HENT:  Head: Normocephalic.  Neck: Normal range of motion.  GI: Soft.  Neurological: She is alert.  Skin: Skin is warm.    MAU Course  Procedures  MDM -NST: 130 bpm, mod var, present acel, neg decels,  -Patient felt strong fetal movements while in MAU; denies pain, bleeding, LOS or other ob-gyn complaint at this time.  -Patient reassured by NST.   Patient Vitals for the past 24 hrs:  BP Temp Temp src Pulse Resp SpO2 Height Weight  11/25/18 1233 130/78 98.5 F (36.9 C) Oral 89 18 99 % - -  11/25/18 1229 - - - - - - 5\' 4"  (1.626 m) 87.4 kg     Assessment and Plan   1. NST (non-stress test) reactive    2. Patient stable for discharge with review of fetal kick counts.   3. Reviewed warning signs and when to return to MAU  4. Plan to keep follow up appt at [facility] this week.   5. All questions answered.  Mervyn Skeeters Kooistra 11/25/2018, 1:06 PM

## 2018-11-30 ENCOUNTER — Other Ambulatory Visit (HOSPITAL_COMMUNITY)
Admission: AD | Admit: 2018-11-30 | Discharge: 2018-11-30 | Disposition: A | Discharge status: Home / Self Care | Payer: 59 | Source: Ambulatory Visit | Attending: Obstetrics and Gynecology | Admitting: Obstetrics and Gynecology

## 2018-11-30 ENCOUNTER — Other Ambulatory Visit: Payer: Self-pay

## 2018-11-30 DIAGNOSIS — Z1159 Encounter for screening for other viral diseases: Secondary | ICD-10-CM | POA: Insufficient documentation

## 2018-12-01 ENCOUNTER — Other Ambulatory Visit (HOSPITAL_COMMUNITY): Payer: Self-pay | Admitting: *Deleted

## 2018-12-01 LAB — NOVEL CORONAVIRUS, NAA (HOSP ORDER, SEND-OUT TO REF LAB; TAT 18-24 HRS): SARS-CoV-2, NAA: NOT DETECTED

## 2018-12-04 ENCOUNTER — Other Ambulatory Visit: Payer: Self-pay

## 2018-12-04 ENCOUNTER — Inpatient Hospital Stay (HOSPITAL_COMMUNITY)
Admission: RE | Admit: 2018-12-04 | Discharge: 2018-12-06 | DRG: 805 | Disposition: A | Discharge status: Home / Self Care | Payer: 59 | Attending: Obstetrics and Gynecology | Admitting: Obstetrics and Gynecology

## 2018-12-04 ENCOUNTER — Inpatient Hospital Stay (HOSPITAL_COMMUNITY): Payer: 59 | Admitting: Anesthesiology

## 2018-12-04 ENCOUNTER — Encounter (HOSPITAL_COMMUNITY): Payer: Self-pay | Admitting: *Deleted

## 2018-12-04 ENCOUNTER — Inpatient Hospital Stay (HOSPITAL_COMMUNITY): Payer: 59

## 2018-12-04 DIAGNOSIS — O26893 Other specified pregnancy related conditions, third trimester: Secondary | ICD-10-CM | POA: Diagnosis present

## 2018-12-04 DIAGNOSIS — I341 Nonrheumatic mitral (valve) prolapse: Secondary | ICD-10-CM | POA: Diagnosis present

## 2018-12-04 DIAGNOSIS — Z3A39 39 weeks gestation of pregnancy: Secondary | ICD-10-CM | POA: Diagnosis not present

## 2018-12-04 DIAGNOSIS — D649 Anemia, unspecified: Secondary | ICD-10-CM | POA: Diagnosis not present

## 2018-12-04 DIAGNOSIS — O9942 Diseases of the circulatory system complicating childbirth: Secondary | ICD-10-CM | POA: Diagnosis present

## 2018-12-04 DIAGNOSIS — O9902 Anemia complicating childbirth: Secondary | ICD-10-CM | POA: Diagnosis present

## 2018-12-04 LAB — CBC
HCT: 34.9 % — ABNORMAL LOW (ref 36.0–46.0)
Hemoglobin: 11.7 g/dL — ABNORMAL LOW (ref 12.0–15.0)
MCH: 29.6 pg (ref 26.0–34.0)
MCHC: 33.5 g/dL (ref 30.0–36.0)
MCV: 88.4 fL (ref 80.0–100.0)
Platelets: 187 10*3/uL (ref 150–400)
RBC: 3.95 MIL/uL (ref 3.87–5.11)
RDW: 13.8 % (ref 11.5–15.5)
WBC: 11.5 10*3/uL — ABNORMAL HIGH (ref 4.0–10.5)
nRBC: 0 % (ref 0.0–0.2)

## 2018-12-04 LAB — TYPE AND SCREEN
ABO/RH(D): A POS
Antibody Screen: NEGATIVE

## 2018-12-04 LAB — ABO/RH: ABO/RH(D): A POS

## 2018-12-04 LAB — RPR: RPR Ser Ql: NONREACTIVE

## 2018-12-04 MED ORDER — SOD CITRATE-CITRIC ACID 500-334 MG/5ML PO SOLN: 30.0000 mL | ORAL | Status: DC | PRN

## 2018-12-04 MED ORDER — LACTATED RINGERS IV SOLN
INTRAVENOUS | Status: DC
  Administered 2018-12-04 (×3): via INTRAVENOUS

## 2018-12-04 MED ORDER — SODIUM CHLORIDE (PF) 0.9 % IJ SOLN
INTRAMUSCULAR | Status: DC | PRN
  Administered 2018-12-04: 12 mL/h via EPIDURAL

## 2018-12-04 MED ORDER — OXYTOCIN BOLUS FROM INFUSION
500.0000 mL | Freq: Once | INTRAVENOUS | Status: AC
  Administered 2018-12-04: 500 mL via INTRAVENOUS

## 2018-12-04 MED ORDER — LACTATED RINGERS IV SOLN: 500.0000 mL | Freq: Once | INTRAVENOUS | Status: DC

## 2018-12-04 MED ORDER — ONDANSETRON HCL 4 MG/2ML IJ SOLN
4.0000 mg | Freq: Four times a day (QID) | INTRAMUSCULAR | Status: DC | PRN
  Administered 2018-12-04: 4 mg via INTRAVENOUS
  Filled 2018-12-04: qty 2

## 2018-12-04 MED ORDER — FENTANYL-BUPIVACAINE-NACL 0.5-0.125-0.9 MG/250ML-% EP SOLN
12.0000 mL/h | EPIDURAL | Status: DC | PRN
  Filled 2018-12-04: qty 250

## 2018-12-04 MED ORDER — EPHEDRINE 5 MG/ML INJ: 10.0000 mg | INTRAVENOUS | Status: DC | PRN

## 2018-12-04 MED ORDER — OXYTOCIN 40 UNITS IN NORMAL SALINE INFUSION - SIMPLE MED
1.0000 m[IU]/min | INTRAVENOUS | Status: DC
  Administered 2018-12-04: 2 m[IU]/min via INTRAVENOUS
  Filled 2018-12-04: qty 1000

## 2018-12-04 MED ORDER — PHENYLEPHRINE 40 MCG/ML (10ML) SYRINGE FOR IV PUSH (FOR BLOOD PRESSURE SUPPORT): 80.0000 ug | PREFILLED_SYRINGE | INTRAVENOUS | Status: DC | PRN

## 2018-12-04 MED ORDER — DIPHENHYDRAMINE HCL 50 MG/ML IJ SOLN: 12.5000 mg | INTRAMUSCULAR | Status: DC | PRN

## 2018-12-04 MED ORDER — OXYCODONE-ACETAMINOPHEN 5-325 MG PO TABS: 2.0000 | ORAL_TABLET | ORAL | Status: DC | PRN

## 2018-12-04 MED ORDER — TERBUTALINE SULFATE 1 MG/ML IJ SOLN: 0.2500 mg | Freq: Once | INTRAMUSCULAR | Status: DC | PRN

## 2018-12-04 MED ORDER — METHYLERGONOVINE MALEATE 0.2 MG/ML IJ SOLN
INTRAMUSCULAR | Status: AC
  Administered 2018-12-04: 0.2 mg via INTRAMUSCULAR
  Filled 2018-12-04: qty 1

## 2018-12-04 MED ORDER — LIDOCAINE HCL (PF) 1 % IJ SOLN
INTRAMUSCULAR | Status: DC | PRN
  Administered 2018-12-04: 6 mL via EPIDURAL
  Administered 2018-12-04: 5 mL via EPIDURAL

## 2018-12-04 MED ORDER — LIDOCAINE HCL (PF) 1 % IJ SOLN
30.0000 mL | INTRAMUSCULAR | Status: AC | PRN
  Administered 2018-12-04: 30 mL via SUBCUTANEOUS
  Filled 2018-12-04: qty 30

## 2018-12-04 MED ORDER — OXYCODONE-ACETAMINOPHEN 5-325 MG PO TABS: 1.0000 | ORAL_TABLET | ORAL | Status: DC | PRN

## 2018-12-04 MED ORDER — LACTATED RINGERS IV SOLN: 500.0000 mL | INTRAVENOUS | Status: DC | PRN

## 2018-12-04 MED ORDER — ACETAMINOPHEN 325 MG PO TABS: 650.0000 mg | ORAL_TABLET | ORAL | Status: DC | PRN

## 2018-12-04 MED ORDER — METHYLERGONOVINE MALEATE 0.2 MG/ML IJ SOLN
0.2000 mg | Freq: Once | INTRAMUSCULAR | Status: AC
  Administered 2018-12-04: 0.2 mg via INTRAMUSCULAR

## 2018-12-04 MED ORDER — OXYTOCIN 40 UNITS IN NORMAL SALINE INFUSION - SIMPLE MED: 2.5000 [IU]/h | INTRAVENOUS | Status: DC

## 2018-12-04 MED ORDER — BUTORPHANOL TARTRATE 1 MG/ML IJ SOLN: 1.0000 mg | INTRAMUSCULAR | Status: DC | PRN

## 2018-12-04 NOTE — H&P (Signed)
Melissa Ashley is a 23 y.o. female, G1P0, EGA 39+ weeks with EDC 6-2 presenting for elective induction.  Prenatal care complicated only by some borderline elevated BP with normal labs.  OB History    Gravida  1   Para      Term      Preterm      AB      Living        SAB      TAB      Ectopic      Multiple      Live Births             Past Medical History:  Diagnosis Date  . Mitral valve prolapse    asymptomatic  . Scoliosis    pt has xray pic from few years ago   Past Surgical History:  Procedure Laterality Date  . TONSILLECTOMY     hemorrhaged   Family History: family history includes Cancer in her maternal grandmother; Hypertension in her father. Social History:  reports that she has never smoked. She has never used smokeless tobacco. She reports that she does not drink alcohol or use drugs.     Maternal Diabetes: No Genetic Screening: Normal Maternal Ultrasounds/Referrals: Normal Fetal Ultrasounds or other Referrals:  None Maternal Substance Abuse:  No Significant Maternal Medications:  None Significant Maternal Lab Results:  Lab values include: Group B Strep negative Other Comments:  None  Review of Systems  Respiratory: Negative.   Cardiovascular: Negative.    Maternal Medical History:  Contractions: Perceived severity is mild.    Fetal activity: Perceived fetal activity is normal.    Prenatal complications: no prenatal complications Prenatal Complications - Diabetes: none.    Dilation: 1.5 Effacement (%): Thick Station: -3 Exam by:: J.Cox, RN Blood pressure (!) 130/92, pulse 95, temperature 98.4 F (36.9 C), temperature source Oral, resp. rate 18, height 5\' 4"  (1.626 m), weight 89.4 kg, last menstrual period 02/28/2018. Maternal Exam:  Uterine Assessment: Contraction strength is mild.  Contraction frequency is rare.   Abdomen: Patient reports no abdominal tenderness. Estimated fetal weight is 7 lbs.   Fetal presentation:  vertex  Introitus: Normal vulva. Normal vagina.  Amniotic fluid character: not assessed.  Pelvis: adequate for delivery.   Cervix: Cervix evaluated by digital exam.     Fetal Exam Fetal Monitor Review: Mode: ultrasound.   Baseline rate: 130s.  Variability: moderate (6-25 bpm).   Pattern: accelerations present and no decelerations.    Fetal State Assessment: Category I - tracings are normal.     Physical Exam  Vitals reviewed. Constitutional: She appears well-developed and well-nourished.  Cardiovascular: Normal rate and regular rhythm.  Respiratory: Effort normal. No respiratory distress.  GI: Soft.  Genitourinary:    Vulva normal.     Prenatal labs: ABO, Rh: A/Positive/-- (11/04 0000) Antibody: Negative (11/04 0000) Rubella: Immune (11/04 0000) RPR: Nonreactive (11/04 0000)  HBsAg: Negative (11/04 0000)  HIV: Non-reactive (11/04 0000)  GBS: Negative (05/08 0000)   Assessment/Plan: IUP at 39+ weeks for elective induction.  Will start pitocin, monitor progress, anticipate SVD   Blane Ohara Melissa Ashley 12/04/2018, 8:23 AM

## 2018-12-04 NOTE — Anesthesia Procedure Notes (Signed)
Epidural Patient location during procedure: OB Start time: 12/04/2018 3:20 PM End time: 12/04/2018 3:25 PM  Staffing Anesthesiologist: Lyn Hollingshead, MD Performed: anesthesiologist   Preanesthetic Checklist Completed: patient identified, site marked, surgical consent, pre-op evaluation, timeout performed, IV checked, risks and benefits discussed and monitors and equipment checked  Epidural Patient position: sitting Prep: site prepped and draped and DuraPrep Patient monitoring: continuous pulse ox and blood pressure Approach: midline Location: L3-L4 Injection technique: LOR air  Needle:  Needle type: Tuohy  Needle gauge: 17 G Needle length: 9 cm and 9 Needle insertion depth: 5 cm cm Catheter type: closed end flexible Catheter size: 19 Gauge Catheter at skin depth: 10 cm Test dose: negative and Other  Assessment Sensory level: T9 Events: blood not aspirated, injection not painful, no injection resistance, negative IV test and no paresthesia  Additional Notes Reason for block:procedure for pain

## 2018-12-04 NOTE — Progress Notes (Signed)
Feeling some ctx Afeb, VSS FHT- 130s, Cat I, ctx q 2-4 min VE-2-3/50/-2, vtx, AROM clear Continue pitocin, monitor progress

## 2018-12-04 NOTE — Anesthesia Preprocedure Evaluation (Signed)
Anesthesia Evaluation  Patient identified by MRN, date of birth, ID band Patient awake    Reviewed: Allergy & Precautions, H&P , NPO status , Patient's Chart, lab work & pertinent test results  Airway Mallampati: II  TM Distance: >3 FB Neck ROM: full    Dental no notable dental hx.    Pulmonary neg pulmonary ROS,    Pulmonary exam normal breath sounds clear to auscultation       Cardiovascular negative cardio ROS   Rhythm:regular Rate:Normal     Neuro/Psych negative neurological ROS  negative psych ROS   GI/Hepatic negative GI ROS, Neg liver ROS,   Endo/Other  negative endocrine ROS  Renal/GU negative Renal ROS  negative genitourinary   Musculoskeletal negative musculoskeletal ROS (+)   Abdominal (+) + obese,   Peds  Hematology  (+) Blood dyscrasia, anemia ,   Anesthesia Other Findings   Reproductive/Obstetrics (+) Pregnancy                             Anesthesia Physical Anesthesia Plan  ASA: II  Anesthesia Plan: Epidural   Post-op Pain Management:    Induction:   PONV Risk Score and Plan:   Airway Management Planned:   Additional Equipment:   Intra-op Plan:   Post-operative Plan:   Informed Consent: I have reviewed the patients History and Physical, chart, labs and discussed the procedure including the risks, benefits and alternatives for the proposed anesthesia with the patient or authorized representative who has indicated his/her understanding and acceptance.       Plan Discussed with:   Anesthesia Plan Comments:         Anesthesia Quick Evaluation

## 2018-12-04 NOTE — Progress Notes (Signed)
Comfortable with epidural Afeb, VSS FHT- 120s, mod variability, some early and some variable decels, + accels, Cat II, adequate ctx VE-6/90/0, vtx Continue pitocin, monitor progress, anticipate SVD

## 2018-12-04 NOTE — Progress Notes (Signed)
Comfortable with epidural Afeb, VSS FHT- 120-130, mod variaiblity, early and some variable decels, + accels, + scalp stim, Cat II, ctx q 2-4 min VE-4/70/-1, vtx, IUPC placed Continue pitocin, monitor progress

## 2018-12-05 ENCOUNTER — Encounter (HOSPITAL_COMMUNITY): Payer: Self-pay

## 2018-12-05 LAB — CBC
HCT: 31.3 % — ABNORMAL LOW (ref 36.0–46.0)
Hemoglobin: 10.6 g/dL — ABNORMAL LOW (ref 12.0–15.0)
MCH: 29.9 pg (ref 26.0–34.0)
MCHC: 33.9 g/dL (ref 30.0–36.0)
MCV: 88.2 fL (ref 80.0–100.0)
Platelets: 193 10*3/uL (ref 150–400)
RBC: 3.55 MIL/uL — ABNORMAL LOW (ref 3.87–5.11)
RDW: 13.6 % (ref 11.5–15.5)
WBC: 17.6 10*3/uL — ABNORMAL HIGH (ref 4.0–10.5)
nRBC: 0 % (ref 0.0–0.2)

## 2018-12-05 MED ORDER — OXYCODONE HCL 5 MG PO TABS: 10.0000 mg | ORAL_TABLET | ORAL | Status: DC | PRN

## 2018-12-05 MED ORDER — SIMETHICONE 80 MG PO CHEW: 80.0000 mg | CHEWABLE_TABLET | ORAL | Status: DC | PRN

## 2018-12-05 MED ORDER — METHYLERGONOVINE MALEATE 0.2 MG PO TABS: 0.2000 mg | ORAL_TABLET | ORAL | Status: DC | PRN

## 2018-12-05 MED ORDER — DIPHENHYDRAMINE HCL 25 MG PO CAPS: 25.0000 mg | ORAL_CAPSULE | Freq: Four times a day (QID) | ORAL | Status: DC | PRN

## 2018-12-05 MED ORDER — IBUPROFEN 600 MG PO TABS
600.0000 mg | ORAL_TABLET | Freq: Four times a day (QID) | ORAL | Status: DC
  Administered 2018-12-05 – 2018-12-06 (×6): 600 mg via ORAL
  Filled 2018-12-05 (×6): qty 1

## 2018-12-05 MED ORDER — WITCH HAZEL-GLYCERIN EX PADS: 1.0000 "application " | MEDICATED_PAD | CUTANEOUS | Status: DC | PRN

## 2018-12-05 MED ORDER — MEASLES, MUMPS & RUBELLA VAC IJ SOLR: 0.5000 mL | Freq: Once | INTRAMUSCULAR | Status: DC

## 2018-12-05 MED ORDER — DIBUCAINE (PERIANAL) 1 % EX OINT: 1.0000 "application " | TOPICAL_OINTMENT | CUTANEOUS | Status: DC | PRN

## 2018-12-05 MED ORDER — MAGNESIUM HYDROXIDE 400 MG/5ML PO SUSP: 30.0000 mL | ORAL | Status: DC | PRN

## 2018-12-05 MED ORDER — ONDANSETRON HCL 4 MG/2ML IJ SOLN: 4.0000 mg | INTRAMUSCULAR | Status: DC | PRN

## 2018-12-05 MED ORDER — SENNOSIDES-DOCUSATE SODIUM 8.6-50 MG PO TABS
2.0000 | ORAL_TABLET | ORAL | Status: DC
  Administered 2018-12-05 (×2): 2 via ORAL
  Filled 2018-12-05 (×2): qty 2

## 2018-12-05 MED ORDER — ZOLPIDEM TARTRATE 5 MG PO TABS: 5.0000 mg | ORAL_TABLET | Freq: Every evening | ORAL | Status: DC | PRN

## 2018-12-05 MED ORDER — METHYLERGONOVINE MALEATE 0.2 MG/ML IJ SOLN: 0.2000 mg | INTRAMUSCULAR | Status: DC | PRN

## 2018-12-05 MED ORDER — ACETAMINOPHEN 325 MG PO TABS: 650.0000 mg | ORAL_TABLET | ORAL | Status: DC | PRN

## 2018-12-05 MED ORDER — COCONUT OIL OIL: 1.0000 "application " | TOPICAL_OIL | Status: DC | PRN

## 2018-12-05 MED ORDER — TETANUS-DIPHTH-ACELL PERTUSSIS 5-2.5-18.5 LF-MCG/0.5 IM SUSP: 0.5000 mL | Freq: Once | INTRAMUSCULAR | Status: DC

## 2018-12-05 MED ORDER — PRENATAL MULTIVITAMIN CH
1.0000 | ORAL_TABLET | Freq: Every day | ORAL | Status: DC
  Administered 2018-12-05 – 2018-12-06 (×2): 1 via ORAL
  Filled 2018-12-05 (×2): qty 1

## 2018-12-05 MED ORDER — BENZOCAINE-MENTHOL 20-0.5 % EX AERO
1.0000 "application " | INHALATION_SPRAY | CUTANEOUS | Status: DC | PRN
  Administered 2018-12-05: 1 via TOPICAL
  Filled 2018-12-05: qty 56

## 2018-12-05 MED ORDER — OXYCODONE HCL 5 MG PO TABS: 5.0000 mg | ORAL_TABLET | ORAL | Status: DC | PRN

## 2018-12-05 MED ORDER — ONDANSETRON HCL 4 MG PO TABS: 4.0000 mg | ORAL_TABLET | ORAL | Status: DC | PRN

## 2018-12-05 NOTE — Lactation Note (Signed)
This note was copied from a baby's chart. Lactation Consultation Note  Patient Name: [name] Date: 12/05/2018 Reason for consult: Follow-up assessment   2045 - 2108 - [name] paged for [facility] to observe her breast feed her daughter. She has been able to latch her baby, but her nipples are a little sore, and sometimes she experiences a biting sensation when baby is latched. She states that baby is feeding well overall, and she is asking for someone to observe her for reassurance.  We attempted to feed baby first in football hold on mom's left breast and then in cross cradle hold. Baby was too reluctant to latch even with gentle pestering and hand expression.   Mom has colostrum that she hand expressed at her bedside. She also has a manual pump. She is doing a combination of breast feeding and hand expression and spoon feeding. I encouraged mom to go ahead and feed her expressed milk.  [name] would like an LC to observe her feed prior to discharge. I observed that baby has good tongue lift and extension and her palette felt normal. Mom will page again when baby is showing readiness to feed.   Maternal Data Formula Feeding for Exclusion: No Has patient been taught Hand Expression?: Yes Does the patient have breastfeeding experience prior to this delivery?: No  Feeding    LATCH Score Latch: Too sleepy or reluctant, no latch achieved, no sucking elicited.  Audible Swallowing: None  Type of Nipple: Everted at rest and after stimulation  Comfort (Breast/Nipple): Filling, red/small blisters or bruises, mild/mod discomfort  Hold (Positioning): No assistance needed to correctly position infant at breast.  LATCH Score: 5  Interventions Interventions: Assisted with latch;Skin to skin;Hand express;Breast compression;Support pillows;Position options  Lactation Tools Discussed/Used WIC Program: No   Consult Status Consult Status: Follow-up Date: 12/06/18 Follow-up  type: In-patient    Lenore Manner 12/05/2018, 9:44 PM

## 2018-12-05 NOTE — Progress Notes (Signed)
Patient ID: Melissa Ashley, female   DOB: 30-Oct-1995, 23 y.o.   MRN: [id number] PPD#1 Pt reports some soreness in vaginal area but tolerable. She is bonding well with baby. She denies any lightheadedness, HA, CP or SOB. Ambulating and voiding with no issues VS 134-141/71-85 GEN - NAD ABD - soft, ND, appropriate tenderness EXT - no homans  A/P: PPD#1 s/p svd - stable         Routine pp care; will check cbc per pt request         Monitor BPs

## 2018-12-05 NOTE — Progress Notes (Signed)
Patient ID: Melissa Ashley, female   DOB: [birth year], 23 y.o.   MRN: [id number] Chart update 17.6> 10.6<193  Pt notified

## 2018-12-05 NOTE — Progress Notes (Signed)
Patient ID: Melissa Ashley, female   DOB: March 05, 1996, 23 y.o.   MRN: [id number] Pt doing well with no complaints Continue routine pp care

## 2018-12-05 NOTE — Lactation Note (Signed)
This note was copied from a baby's chart. Lactation Consultation Note  Patient Name: Melissa Ashley KYHCW'C Date: 12/05/2018 Reason for consult: Follow-up assessment;Mother's request;Difficult latch;1st time breastfeeding(elevated bilirubin)  2239 - 2304 - Melissa Ashley paged for feeding assistance. We attempted to feed baby in football hold. Baby was becoming frustrated and would not latch. We moved baby to cross cradle hold on mom's left breast with similar results. I helped mom place baby skin to skin and retrieved a nipple shield.  I placed a size 24 mm nipple shield on mom's breast, and I sprinkled some of mom's expressed colostrum onto the shield. Baby latched with rhythmic suckling sequences. Mom states that this is the most comfortable latch thus far. I showed mom gentle ways to "pester" baby to keep her active.  When baby released the breast, mom's nipple appeared round and in tact. I did not note any breast milk in the shield, however. I assisted with mom latching baby again to the breast and sprinkled a little colostrum onto the shield to encourage baby.  Baby has good extension of her tongue and good lateralization. However, when I conducted a suck exam using a gloved finger, I noted that baby broke suction frequently (this is audible). I did not hear similar breaks in suction when baby is on the breast, however. I did discuss this with mom, and we agreed to watch and wait. It's possible the the nipple shield is filling baby's mouth in a way that is helping her maintain a good seal. Mom's left nipple retracts with compression, and the nipple shield did evert it. Mom is using her breast shells as well.  In the meantime, mom has a hand pump in the room, and she is adept at hand expressing. She states that she can remove more colostrum via hand expression. I encouraged mom to do frequent hand expression and feed her EBM to baby. I also discussed use of a DEBP. I encouraged frequent feedings  and to supplement with her milk following.  I spoke with RN about setting up a DEBP tonight for additional breast stimulation. Mom has copious colostrum and may be able to pump some colostrum at this time. Mom agreed to this plan.  Maternal Data Formula Feeding for Exclusion: No  LATCH Score Latch: Grasps breast easily, tongue down, lips flanged, rhythmical sucking.  Audible Swallowing: None  Type of Nipple: Everted at rest and after stimulation  Comfort (Breast/Nipple): Soft / non-tender  Hold (Positioning): Assistance needed to correctly position infant at breast and maintain latch.  LATCH Score: 7  Interventions Interventions: Assisted with latch;Hand express;Adjust position;Support pillows;Position options;Expressed milk(nipple shield)  Lactation Tools Discussed/Used  Nipple shield; expressed breast milk   Consult Status Consult Status: Follow-up Date: 12/06/18 Follow-up type: In-patient    Melissa Ashley 12/05/2018, 11:13 PM

## 2018-12-05 NOTE — Lactation Note (Signed)
This note was copied from a baby's chart. Lactation Consultation Note  Patient Name: Melissa Ashley [id number]'J Date: 12/05/2018 Reason for consult: Initial assessment;Term;1st time breastfeeding P1, 6 hour female infant. Infant had one void and one stool since delivery. 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 Mom has breastfeed infant 2x since birth. Per mom, she feels infant is latching well, infant breastfeed at 3:30 for 15 minutes and mom hand expressed 3 ml that was spoon feed to infant. Per mom, infant has been latching well breastfeed in L&D for 15 minutes and 15 minutes in room. LC did not observe a latch at this time. Mom was given a hand pump by Nurse and [place] refitted mom with 27 breast flange. Mom a little short shafted, she will do breast stimulation, a little hand expression or pre-pump prior to latching infant to breast.  LC reviewed hand expression and mom expressed 6 ml of colostrum that she will offer after she breastfeed infant at next feeding. Mom knows to breastfeed according hunger cues, 8 to 12 times within 24 hours and on demand. Mom knows to call Nurse or [place] if she need assistance with latching infant to breast.  LC discussed I&O. Reviewed Baby & Me book's Breastfeeding Basics.  Mom made aware of O/P services, breastfeeding support groups, community resources, and our phone # for post-discharge questions.  Maternal Data Formula Feeding for Exclusion: Yes Reason for exclusion: Mother's choice to formula and breast feed on admission Has patient been taught Hand Expression?: Yes(Mom hand expressed 6 ml of colostrum for future feeding.) Does the patient have breastfeeding experience prior to this delivery?: No  Feeding Feeding Type: Breast Fed  LATCH Score                   Interventions Interventions: Breast feeding basics reviewed;Skin to skin;Breast compression;Expressed milk;Pre-pump if needed;Position  options;Hand pump;DEBP;Hand express  Lactation Tools Discussed/Used Pump Review: Setup, frequency, and cleaning Initiated by:: by Nurse  Date initiated:: 12/05/18   Consult Status Consult Status: Follow-up Date: 12/05/18 Follow-up type: In-patient    [name] 12/05/2018, 5:55 AM

## 2018-12-05 NOTE — Anesthesia Postprocedure Evaluation (Signed)
Anesthesia Post Note  Patient: Melissa Ashley  Procedure(s) Performed: AN AD HOC LABOR EPIDURAL     Patient location during evaluation: Mother Baby Anesthesia Type: Epidural Level of consciousness: awake Pain management: satisfactory to patient Vital Signs Assessment: post-procedure vital signs reviewed and stable Respiratory status: spontaneous breathing Cardiovascular status: stable Anesthetic complications: no    Last Vitals:  Vitals:   12/05/18 0250 12/05/18 0606  BP: 134/71 (!) 141/85  Pulse: 79 78  Resp: 18 16  Temp: 37.3 C 36.9 C  SpO2:      Last Pain:  Vitals:   12/05/18 0606  TempSrc: Oral  PainSc: 2    Pain Goal:                   Casimer Lanius

## 2018-12-05 NOTE — Lactation Note (Signed)
This note was copied from a baby's chart. Lactation Consultation Note  Patient Name: Melissa Ashley OITGP'Q Date: 12/05/2018 Reason for consult: Follow-up assessment;1st time breastfeeding;Primapara;Early term 37-38.6wks  P1 mother whose infant is now 47 hours old.  RN request for assistance due to some pain with pumping using the manual pump.  Baby was asleep in mother's arms when I arrived.  Upon breast observation I noted mother's breasts are soft and non tender and nipples are everted.  It is apparent from reddened areas on mother's areolas that baby has not always latched properly.  Her nipples are reddened and slightly irritated.  Offered to observe/assist with latching the next time baby shows feeding cues.  Mother willingly accepted assistance and will call when baby is ready to feed again.  Comfort gels provided and mother is familiar with how to use them.  Mother has a manual pump at bedside that she has been using to help evert nipples.  #27 flange size changed to a #30 flange size for greater comfort.  During demonstration of pump mother was able to express colostrum drops.  Encouraged her to rub EBM into nipples/areolas prior to using comfort gels.    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  She will return to work after 12 weeks of leave.  Father present.   Maternal Data Formula Feeding for Exclusion: No Has patient been taught Hand Expression?: Yes Does the patient have breastfeeding experience prior to this delivery?: No  Feeding    LATCH Score                   Interventions    Lactation Tools Discussed/Used WIC Program: No   Consult Status Consult Status: Follow-up Date: 12/06/18 Follow-up type: In-patient    Little Ishikawa 12/05/2018, 6:47 PM

## 2018-12-06 MED ORDER — ACETAMINOPHEN 325 MG PO TABS: 650.0000 mg | ORAL_TABLET | ORAL | 0 refills | Status: DC | PRN

## 2018-12-06 MED ORDER — IBUPROFEN 600 MG PO TABS: 600.0000 mg | ORAL_TABLET | Freq: Four times a day (QID) | ORAL | 0 refills | Status: DC

## 2018-12-06 MED FILL — IBUPROFEN 600 MG TABLET: 600 | 8 days supply | Qty: 30 | Fill #0

## 2018-12-06 NOTE — Discharge Summary (Signed)
OB Discharge Summary     Patient Name: Melissa Ashley DOB: May 15, 1996 MRN: [id number]  Date of admission: 12/04/2018 Delivering MD: Willis Modena, TODD   Date of discharge: 12/06/2018  Admitting diagnosis: No admission diagnoses are documented for this encounter. Intrauterine pregnancy: [redacted]w[redacted]d     Secondary diagnosis:  Active Problems:   Indication for care in labor or delivery   SVD (spontaneous vaginal delivery)  Additional problems: mild postpartum hemorrhage (895mL)     Discharge diagnosis: Term Pregnancy Delivered                                                                                                Post partum procedures:none  Augmentation: AROM and Pitocin  Complications: None  Hospital course:  Induction of Labor With Vaginal Delivery   23 y.o. yo G1P1001 at [redacted]w[redacted]d was admitted to the hospital 12/04/2018 for induction of labor.  Indication for induction: Favorable cervix at term.  Patient had an uncomplicated labor course as follows: Membrane Rupture Time/Date: 11:57 AM ,12/04/2018   Intrapartum Procedures: Episiotomy: None [1]                                         Lacerations:  2nd degree [3];Perineal [11];Vaginal [6]  Patient had delivery of a Viable infant.  Information for the patient's newborn:  Nakayla, [place] Girl Jianni [[id number]]  Delivery Method: Vaginal, Spontaneous(Filed from Delivery Summary)   12/04/2018  Details of delivery can be found in separate delivery note.  Patient had a routine postpartum course. Patient is discharged home 12/06/18.  Physical exam  Vitals:   12/05/18 1038 12/05/18 1540 12/05/18 2202 12/06/18 0602  BP: 123/75 128/74 112/78 120/71  Pulse: 73 70 79 62  Resp: 16 18    Temp: 98.9 F (37.2 C) 97.8 F (36.6 C) 97.9 F (36.6 C) 97.7 F (36.5 C)  TempSrc: Oral Oral    SpO2: 98% 98% 99% 99%  Weight:      Height:       General: alert and cooperative Lochia: appropriate Uterine Fundus: firm  Labs: Lab Results  Component Value  Date   WBC 17.6 (H) 12/05/2018   HGB 10.6 (L) 12/05/2018   HCT 31.3 (L) 12/05/2018   MCV 88.2 12/05/2018   PLT 193 12/05/2018   CMP Latest Ref Rng & Units 10/24/2018  Glucose 70 - 99 mg/dL 108(H)  BUN 6 - 20 mg/dL 6  Creatinine 0.44 - 1.00 mg/dL 0.49  Sodium 135 - 145 mmol/L 136  Potassium 3.5 - 5.1 mmol/L 3.0(L)  Chloride 98 - 111 mmol/L 106  CO2 22 - 32 mmol/L 21(L)  Calcium 8.9 - 10.3 mg/dL 8.5(L)  Total Protein 6.5 - 8.1 g/dL 5.7(L)  Total Bilirubin 0.3 - 1.2 mg/dL 0.8  Alkaline Phos 38 - 126 U/L 102  AST 15 - 41 U/L 15  ALT 0 - 44 U/L 12    Discharge instruction: per After Visit Summary and "Baby and Me Booklet".  After visit meds:  Allergies as of 12/06/2018  Reactions   Neomycin Swelling   Shellfish Allergy Itching, Swelling   Sulfa Antibiotics Swelling      Medication List    STOP taking these medications   Butalbital-APAP-Caffeine 50-325-40 MG capsule     TAKE these medications   acetaminophen 325 MG tablet Commonly known as:  Tylenol Take 2 tablets (650 mg total) by mouth every 4 (four) hours as needed (for pain scale < 4).   docusate sodium 100 MG capsule Commonly known as:  COLACE Take 100 mg by mouth daily as needed for mild constipation.   ibuprofen 600 MG tablet Commonly known as:  ADVIL Take 1 tablet (600 mg total) by mouth every 6 (six) hours.   prenatal multivitamin Tabs tablet Take 1 tablet by mouth daily at 12 noon.   witch hazel-glycerin pad Commonly known as:  TUCKS Apply 1 application topically as needed for itching.       Diet: routine diet  Activity: Advance as tolerated. Pelvic rest for 6 weeks.   Outpatient follow up:6 weeks Follow up Appt:No future appointments. Follow up Visit:No follow-ups on file.  Postpartum contraception: Progesterone only pills  Newborn Data: Live born female  Birth Weight: 7 lb 15.2 oz (3606 g) APGAR: 8, 9  Newborn Delivery   Birth date/time:  12/04/2018 22:56:00 Delivery type:  Vaginal,  Spontaneous     Baby Feeding: Breast Disposition:home with mother   12/06/2018 Logan Bores, MD

## 2018-12-06 NOTE — Progress Notes (Signed)
Post Partum Day 2 Subjective: no complaints, up ad lib and tolerating PO  Objective: Blood pressure 120/71, pulse 62, temperature 97.7 F (36.5 C), resp. rate 18, height 5\' 4"  (1.626 m), weight 89.4 kg, last menstrual period 02/28/2018, SpO2 99 %, unknown if currently breastfeeding.  Physical Exam:  General: alert and cooperative Lochia: appropriate Uterine Fundus: firm   Recent Labs    12/04/18 0803 12/05/18 1200  HGB 11.7* 10.6*  HCT 34.9* 31.3*    Assessment/Plan: Discharge home   LOS: 2 days   Logan Bores 12/06/2018, 10:17 AM

## 2018-12-06 NOTE — Lactation Note (Signed)
This note was copied from a baby's chart. Lactation Consultation Note:  [place] arrived in mothers room, observed infant crying and mother trying to soothe infant. Suggested that  FOB  hold infant for a few mins and try and soothe infant.  Infant continue to cry. LC swallowed infant to sooth infant.  Infant had a large meconium stool and father changed diaper.   Infant placed back to breast with proper pillow placement. Mother assisted with cross cradle hold.  Mother taught to properly apply the NS. #24 NS fit well. Infant latched and tongue thrust the nipple. On and off for 5 mins with few sucks and no swallows observed. No noted milk in the shield.  Mother reports that she has see milk in the shield with other feedings.   Suggested that mother try the bare breast again. Mothers nipples firm well.  Mother taught to do tea-cup hold and off sided latch.  Infant latched on the left breast in football hold. Observed that infant has a tight upper lip and has a suck blister on top lip.  Difficulty to adjust infants lower jaw for wider gape. Several attempts were made and mother reported that latch felt better with a pain scale of 2-3 and then no pain at all.  Infant sustained latch for 10 mins. Mother taught to do breast compression.   Infant was then given 8 ml of ebm with a curved tip syringe and mothers gloved finger.   Discussed cluster feeding . Mother reports that she has her mother for support in the home.  Plan of care Encouraged mother to cue base feed and to offer bare breast before using NS.  Mother to post pump for 15-20 mins every 2-3 hours  Encouraged mother to supplement infant after each breastfeeding.  Discussed hand expression and using hand pump to get the most volume and the use electric pump.   Discussed treatment and prevention of engorgement.  Suggested that mother follow up with OP services pre and post weight assessment when milk comes to volume.  Mother very receptive to all  teaching.    Patient Name: Melissa Ashley [id number] Date: 12/06/2018 Reason for consult: Follow-up assessment   Maternal Data    Feeding Feeding Type: Breast Fed  LATCH Score Latch: Repeated attempts needed to sustain latch, nipple held in mouth throughout feeding, stimulation needed to elicit sucking reflex.(assist with flanging infants lips for wider gape)  Audible Swallowing: Spontaneous and intermittent  Type of Nipple: Everted at rest and after stimulation  Comfort (Breast/Nipple): Filling, red/small blisters or bruises, mild/mod discomfort  Hold (Positioning): Assistance needed to correctly position infant at breast and maintain latch.  LATCH Score: 7  Interventions Interventions: Breast feeding basics reviewed;Assisted with latch;Skin to skin;Breast massage;Hand express;Breast compression;Adjust position;Support pillows;Position options;Expressed milk;Comfort gels;Hand pump  Lactation Tools Discussed/Used Tools: (without the shield) Nipple shield size: 24   Consult Status Consult Status: Complete    Darla Lesches 12/06/2018, 11:56 AM

## 2018-12-13 ENCOUNTER — Ambulatory Visit: Payer: Self-pay

## 2018-12-13 NOTE — Lactation Note (Signed)
This note was copied from a baby's chart. Lactation Consultation Note  Patient Name: Melissa Ashley IONGE'X Date: 12/13/2018     12/13/2018  Name: Melissa Ashley MRN: [id number] Date of Birth: 12/04/2019 Gestational Age: Gestational Age: 100w6d Birth Weight: 127.2 oz Weight today:    7 pounds 15.6 ounces (3616 grams) with clean newborn diaper  107 day old infant presents today with mom and dad for feeding assessment. Mom feels BF has improved.   Infant has gained 211 grams in the last 7 days with an average daily weight gain of 30 grams a day. Infant is 10 grams over her birthweight.   Infant self awakens to feed about every 2 hours to feed. She feeds about every hour at night. Infant will feed for 5 minutes to 15 minutes per feeding, she will take one or both breasts per feeding. Mom feels breast softening when feeding.   Infant is choking at the breast with feedings, especially at night. Mom is needing to use the # 24 NS with feedings. Mom reports her nipples are compressed if infant feeds without the NS.   Mom has weaned her pumping from every feeding to 3-4 x a day and has decreased the time she is pumping. She is getting about 4 ounces per pumping. She reports she is comfortable between pumpings/feedings except at night. Mom has a lot of milk stored in her freezer. Mom using a hands free bra for pumping.   Infant with thick labial frenulum that inserts near the bottom of the gum ridge. Upper lip and gum blanches with lip flanging. Infant with suckling blister to center upper. Infant with good tongue extension and lateralization. She is noted to have a posterior lingual frenulum with some decreased mid tongue elevation. Infant sleepy at the breast and tends to eat in small frequent meals. Mom's nipple is compressed post feeding. Infant noted to be tongue thrusting. Tongue and lip restrictions shown to parents and informed how it can effect milk supply latch. Local provider and  website information given for parents to educate themselves and decide if they want to have infant evaluated.   Infant latched to the breast in the cross cradle hold. She fed briefly and transferred 34 ml. Infant sleepy and was difficult to awaken to continue feeding. Infant latched without the NS and nipple was noted to be asymmetrical in shape.   Discussed how tongue restrictions can effect milk supply and enc mom to continue pumping to protect milk supply.    Infant to follow up with Dr. Sharlene Motts on 6/11. Infant to follow up with Lactation as needed, mom to call and make appt. As needed. Mom aware of Virtual Support Groups.       General Information: Mother's reason for visit: Feeding assessment Consult: Initial Lactation consultant: Nonah Mattes RN,IBCLC Breastfeeding experience: latching and feeding for short periods frequently Maternal medical conditions: Pregnancy induced hypertension, Post-partum hemorrhage(965 ml EBL) Maternal medications: Pre-natal vitamin, Motrin (ibuprofen)  Breastfeeding History: Frequency of breast feeding: every 1-3 hours Duration of feeding: 5-15 minutes  Supplementation:                        Infant Output Assessment: Voids per 24 hours: 8+ Urine color: Clear yellow Stools per 24 hours: 5 Stool color: Yellow  Breast Assessment: Breast: Filling, Compressible, Hyperplasia Nipple: Erect Pain level: 0 Pain interventions: Bra, Coconut oil, Breast pump  Feeding Assessment: Infant oral assessment: Variance Infant oral assessment comment: see note  Positioning: Cross cradle(left breast, 10 minutes) Latch: 2 - Grasps breast easily, tongue down, lips flanged, rhythmical sucking. Audible swallowing: 2 - Spontaneous and intermittent Type of nipple: 2 - Everted at rest and after stimulation Comfort: 2 - Soft/non-tender Hold: 2 - No assistance needed to correctly position infant at breast LATCH score: 10 Latch assessment: Deep Lips flanged:  No(upper lip needs flanging) Suck assessment: Displays both Tools: Nipple shield 24 mm Pre-feed weight: 3616 grams Post feed weight: 3650 grams Amount transferred: 34 ml0 Amount supplemented: 0  Additional Feeding Assessment:                                    Totals: Total amount transferred: 34 ml Total supplement given: 0 Total amount pumped post feed: did not pump   Plan: 1. Offer infant the breast with feeding cues 2. Keep infant awake at the breast with feeding as needed, feed infant skin to skin 3. Massage/compress breast with feeding if infant is sleepy at the breast 4. Empty the first breast before offering the second breast 5. Use the # 24 Nipple Shield with feeding as needed, goal is to wean off the Nipple shield as soon as mom and infant able, try each day to feed without it.  6. Infant needs about 68-90 ml (2.5-3 ounces) for 8 feedings a day or 540-720 ml (18-24 ounces) in 24 hours. Infant may take more or less at a feeding depending on how often she feeds. Feed infant until she is satisfied.  7. Continue pumping 3-4 x a day post breast feeding to protect milk supply as long as you are using the nipple shield.  8. Keep up the good work 9. Thank you for allowing me to assist you today 10. Please call with questions/concerns as needed [phone] 11. Follow up with Lactation as needed or 1-5 days post tongue and lip releases if completed   [facility] RN, IBCLC                                                            Debby Freiberg Donnae Michels 12/13/2018, 1:25 PM

## 2019-01-12 ENCOUNTER — Ambulatory Visit: Payer: Self-pay

## 2019-01-12 NOTE — Lactation Note (Signed)
This note was copied from a baby's chart. Lactation Consultation Note  Patient Name: Melissa Ashley Date: 01/12/2019    01/12/2019  Name: Lorenda Grecco MRN: [id number] Date of Birth: 12/04/2019 Gestational Age: Gestational Age: [redacted]w[redacted]d Birth Weight: 127.2 oz Weight today:    9 pounds 7.7 ounces (4300 grams) with clean newborn diaper  4 week old infant presents with mom and dad for follow up feeding assessment. Infant post tongue and lip releases on 7/8 by Dr. Verdene Lennert.   Infant has shown some improvement with feedings per mom since releases. Mom reports her lips are still blanched after feeding.   Infant has gained 684 grams in the last 30 days with an average daily weight gain of 23 grams a day.   Mom started supplementing with pumped breast milk after feedings as her weight was slowing down at a peds appt on 7/1. Infant was also eating for longer periods and feeding very frequently at that time.  Mom has switched to the Tommie Tippee slow flow nipple as infant was smacking a lot on the Dr. Saul Fordyce Nipple. Mom reports she does much better on the Tommie Tippee. Infant has been able to latch some without the NS in the middle of a feeding. Infant very gassy per mom and they are giving Simethicone as needed.   Parents are performing stretches per Dr. Verdene Lennert. Infant is tolerating well.   Mom with pain and redness to left breast earlier this week and was started on ATB-Dicloxacillin TID for 7 days.  Infant latched to the left breast in the cross cradle hold with the # 24 NS. Infant latched and fed well with good swallows at the breast.   Infant with granulation tissue to upper lip. Infant with sucking blister to center upper lip. Lip is very tight with flanging. Mom reports infant can open mouth wider than previously when crying and . Infant with diamond shaped granulation tissue to tongue. Infant with good tongue extension and lateralization and better elevation. Infant  had difficulty forming seal on the gloved finger and mom reports on the Dr. Saul Fordyce Bottle also. Infant chompy on finger and does the same on the breast also. Infant forms a better seal on the wider based nipple and on the breast. Suck training taught for parents to start today 5-6 x a day for 1-2 minutes per exercise for 2-3 weeks. Infant tends to tire on the breast and has chin quivering after breast feeding.   Discussed with parents that infant will need time to learn to form a better seal and that it takes at least 2-4 weeks to see a good improvement with suckling and feeding post tongue and lip releases.   Infant to follow up with Dr. Sharlene Motts August 4. Infant to follow up with Lactation as needed.   General Information: Mother's reason for visit: Follow up feeding assessment, post tongue and lip releases on 7/8 by Dr. Verdene Lennert Consult: Follow-up Lactation consultant: Nonah Mattes RN,IBCLC Breastfeeding experience: BF with NS and supplementing wiht bottles Maternal medical conditions: Pregnancy induced hypertension, Post-partum hemorrhage(> 900 EBL) Maternal medications: Pre-natal vitamin, Other(Dicloxacillin TID for 7 days)  Breastfeeding History: Frequency of breast feeding: 11 feeds a day Duration of feeding: 20-23 minutes, usually one breast per feeding  Supplementation: Supplement method: bottle(Tommie Tippee slow flow nipple)         Breast milk volume: 1-2 ounces Breast milk frequency: 4+   Pump type: Medela pump in style Pump frequency: 2-3 x a day (breakfast,  lunch, dinner or before bed) Pump volume: 4-6 ounces  Infant Output Assessment: Voids per 24 hours: 7 Urine color: Clear yellow Stools per 24 hours: 5 Stool color: Yellow  Breast Assessment: Breast: Soft, Compressible Nipple: Erect   Pain interventions: Bra, Expressed breast milk, Nipple shield  Feeding Assessment: Infant oral assessment: Variance Infant oral assessment comment: see note Positioning: Cross  cradle(left breast, 20 minutes) Latch: 2 - Grasps breast easily, tongue down, lips flanged, rhythmical sucking. Audible swallowing: 2 - Spontaneous and intermittent Type of nipple: 2 - Everted at rest and after stimulation Comfort: 2 - Soft/non-tender Hold: 2 - No assistance needed to correctly position infant at breast LATCH score: 10 Latch assessment: Deep Lips flanged: Yes(lips are tight and blanch with feeding) Suck assessment: Displays both Tools: Nipple shield 24 mm Pre-feed weight: 4300 grams Post feed weight: 4350 grams Amount transferred: 50 ml Amount supplemented: 0  Additional Feeding Assessment: Infant oral assessment: Variance                                  Totals: Total amount transferred: 50 ml Total supplement given: infant fed prior to appt Total amount pumped post feed: did not pump   Plan:  1. Offer infant the breast with feeding cues, make sure she has at least 7-8 feeds a day 2. Keep infant awake at the breast with feeding as needed, feed infant skin to skin 3. Massage/compress breast with feeding if infant is sleepy at the breast 4. Empty the first breast before offering the second breast 5. Use the # 24 Nipple Shield with feeding as needed, goal is to wean off the Nipple shield as soon as mom and infant able, try each day to feed without it.  6. Continue to offer infant supplement after feedings as she has been and especially if she is still cueing to feed.   7. Infant needs about 80-108 ml (3-3.5 ounces) for 8 feedings a day or 645-860 ml (22-29 ounces) in 24 hours. Infant may take more or less at a feeding depending on how often she feeds. Feed infant until she is satisfied.  8. Continue pumping 3-4 x a day post breast feeding to protect milk supply as long as you are using the nipple shield. A good rule of thumb is to pump anytime infant gets a bottle. 9. Continue stretches per Dr. Verdene Lennert 10. Start Suck Training 5-6 x a day for 1-2  minutes per exercise for at least 2-3 weeks 11. Keep up the good work 23. Thank you for allowing me to assist you today 13. Please call with questions/concerns as needed [phone] 14. Follow up with Lactation as needed   Donn Pierini RN, IBCLC                                                      Debby Freiberg Gevin Perea 01/12/2019, 8:15 AM

## 2019-01-17 ENCOUNTER — Other Ambulatory Visit: Payer: Self-pay

## 2019-01-17 ENCOUNTER — Encounter: Payer: Self-pay | Admitting: Family Medicine

## 2019-01-17 ENCOUNTER — Ambulatory Visit (INDEPENDENT_AMBULATORY_CARE_PROVIDER_SITE_OTHER): Payer: 59 | Admitting: Family Medicine

## 2019-01-17 VITALS — BP 121/79 | HR 93 | Temp 98.5°F | Resp 17 | Ht 64.0 in | Wt 166.4 lb

## 2019-01-17 DIAGNOSIS — Z131 Encounter for screening for diabetes mellitus: Secondary | ICD-10-CM

## 2019-01-17 DIAGNOSIS — Z0000 Encounter for general adult medical examination without abnormal findings: Secondary | ICD-10-CM | POA: Diagnosis not present

## 2019-01-17 DIAGNOSIS — Z13220 Encounter for screening for lipoid disorders: Secondary | ICD-10-CM | POA: Diagnosis not present

## 2019-01-17 DIAGNOSIS — D649 Anemia, unspecified: Secondary | ICD-10-CM | POA: Diagnosis not present

## 2019-01-17 DIAGNOSIS — Z124 Encounter for screening for malignant neoplasm of cervix: Secondary | ICD-10-CM | POA: Diagnosis not present

## 2019-01-17 DIAGNOSIS — E663 Overweight: Secondary | ICD-10-CM | POA: Insufficient documentation

## 2019-01-17 DIAGNOSIS — Z7689 Persons encountering health services in other specified circumstances: Secondary | ICD-10-CM | POA: Diagnosis not present

## 2019-01-17 MED FILL — NORLYDA 0.35 MG TABS: 0.35 | 28 days supply | Qty: 28 | Fill #0

## 2019-01-17 NOTE — Patient Instructions (Addendum)
Health Maintenance, Female Adopting a healthy lifestyle and getting preventive care are important in promoting health and wellness. Ask your health care provider about:  The right schedule for you to have regular tests and exams.  Things you can do on your own to prevent diseases and keep yourself healthy. What should I know about diet, weight, and exercise? Eat a healthy diet   Eat a diet that includes plenty of vegetables, fruits, low-fat dairy products, and lean protein.  Do not eat a lot of foods that are high in solid fats, added sugars, or sodium. Maintain a healthy weight Body mass index (BMI) is used to identify weight problems. It estimates body fat based on height and weight. Your health care provider can help determine your BMI and help you achieve or maintain a healthy weight. Get regular exercise Get regular exercise. This is one of the most important things you can do for your health. Most adults should:  Exercise for at least 150 minutes each week. The exercise should increase your heart rate and make you sweat (moderate-intensity exercise).  Do strengthening exercises at least twice a week. This is in addition to the moderate-intensity exercise.  Spend less time sitting. Even light physical activity can be beneficial. Watch cholesterol and blood lipids Have your blood tested for lipids and cholesterol at 24 years of age, then have this test every 5 years. Have your cholesterol levels checked more often if:  Your lipid or cholesterol levels are high.  You are older than 24 years of age.  You are at high risk for heart disease. What should I know about cancer screening? Depending on your health history and family history, you may need to have cancer screening at various ages. This may include screening for:  Breast cancer.  Cervical cancer.  Colorectal cancer.  Skin cancer.  Lung cancer. What should I know about heart disease, diabetes, and high blood  pressure? Blood pressure and heart disease  High blood pressure causes heart disease and increases the risk of stroke. This is more likely to develop in people who have high blood pressure readings, are of African descent, or are overweight.  Have your blood pressure checked: ? Every 3-5 years if you are 18-39 years of age. ? Every year if you are 40 years old or older. Diabetes Have regular diabetes screenings. This checks your fasting blood sugar level. Have the screening done:  Once every three years after age 40 if you are at a normal weight and have a low risk for diabetes.  More often and at a younger age if you are overweight or have a high risk for diabetes. What should I know about preventing infection? Hepatitis B If you have a higher risk for hepatitis B, you should be screened for this virus. Talk with your health care provider to find out if you are at risk for hepatitis B infection. Hepatitis C Testing is recommended for:  Everyone born from 1945 through 1965.  Anyone with known risk factors for hepatitis C. Sexually transmitted infections (STIs)  Get screened for STIs, including gonorrhea and chlamydia, if: ? You are sexually active and are younger than 24 years of age. ? You are older than 24 years of age and your health care provider tells you that you are at risk for this type of infection. ? Your sexual activity has changed since you were last screened, and you are at increased risk for chlamydia or gonorrhea. Ask your health care provider if   you are at risk.  Ask your health care provider about whether you are at high risk for HIV. Your health care provider may recommend a prescription medicine to help prevent HIV infection. If you choose to take medicine to prevent HIV, you should first get tested for HIV. You should then be tested every 3 months for as long as you are taking the medicine. Pregnancy  If you are about to stop having your period (premenopausal) and  you may become pregnant, seek counseling before you get pregnant.  Take 400 to 800 micrograms (mcg) of folic acid every day if you become pregnant.  Ask for birth control (contraception) if you want to prevent pregnancy. Osteoporosis and menopause Osteoporosis is a disease in which the bones lose minerals and strength with aging. This can result in bone fractures. If you are 65 years old or older, or if you are at risk for osteoporosis and fractures, ask your health care provider if you should:  Be screened for bone loss.  Take a calcium or vitamin D supplement to lower your risk of fractures.  Be given hormone replacement therapy (HRT) to treat symptoms of menopause. Follow these instructions at home: Lifestyle  Do not use any products that contain nicotine or tobacco, such as cigarettes, e-cigarettes, and chewing tobacco. If you need help quitting, ask your health care provider.  Do not use street drugs.  Do not share needles.  Ask your health care provider for help if you need support or information about quitting drugs. Alcohol use  Do not drink alcohol if: ? Your health care provider tells you not to drink. ? You are pregnant, may be pregnant, or are planning to become pregnant.  If you drink alcohol: ? Limit how much you use to 0-1 drink a day. ? Limit intake if you are breastfeeding.  Be aware of how much alcohol is in your drink. In the [place] one drink equals one 12 oz bottle of beer (355 mL), one 5 oz glass of wine (148 mL), or one 1 oz glass of hard liquor (44 mL). General instructions  Schedule regular health, dental, and eye exams.  Stay current with your vaccines.  Tell your health care provider if: ? You often feel depressed. ? You have ever been abused or do not feel safe at home. Summary  Adopting a healthy lifestyle and getting preventive care are important in promoting health and wellness.  Follow your health care provider's instructions about healthy  diet, exercising, and getting tested or screened for diseases.  Follow your health care provider's instructions on monitoring your cholesterol and blood pressure. This information is not intended to replace advice given to you by your health care provider. Make sure you discuss any questions you have with your health care provider. Document Released: 01/04/2011 Document Revised: 06/14/2018 Document Reviewed: 06/14/2018 Elsevier Patient Education  2020 Elsevier Inc.  

## 2019-01-17 NOTE — Progress Notes (Signed)
Patient ID: AVYANA PUFFENBARGER, female  DOB: Jan 27, 1996, 23 y.o.   MRN: [id number] Patient Care Team    Relationship Specialty Notifications Start End  Ma Hillock, DO PCP - General Family Medicine  01/17/19     Chief Complaint  Patient presents with  . Annual Exam    pt wants CPE. est care. last pcp dr. Marijean Bravo [facility] [place]. pap 01/17/2019    Subjective:  TIAN DAVISON is a 23 y.o.  female present for new patient establishment. All past medical history, surgical history, allergies, family history, immunizations, medications and social history were updated in the electronic medical record today. All recent labs, ED visits and hospitalizations within the last year were reviewed.  Health maintenance:  Colonoscopy: no fhx. Routine screen. Mammogram: FHx PGM after 60.routine screen at 40 Cervical cancer screening: last pap: 01/17/2019 Dr. Perlie Gold Immunizations: tdap 2020, Influenza  (encouraged yearly) Infectious disease screening: HIV completed DEXA: N/A Assistive device: none Oxygen HLK:TGYB Patient has a Dental home. Hospitalizations/ED visits:  Depression screen [facility] 2/9 01/17/2019  Decreased Interest 0  Down, Depressed, Hopeless 0  PHQ - 2 Score 0   No flowsheet data found.     No flowsheet data found.  There is no immunization history for the selected administration types on file for this patient.  No exam data present  Past Medical History:  Diagnosis Date  . Heart murmur   . Mitral valve prolapse    asymptomatic  . Scoliosis    pt has xray pic from few years ago   Allergies  Allergen Reactions  . Neomycin Swelling  . Shellfish Allergy Itching and Swelling  . Sulfa Antibiotics Swelling   Past Surgical History:  Procedure Laterality Date  . TONSILLECTOMY  2004   hemorrhaged   Family History  Problem Relation Age of Onset  . Hypertension Father   . Cancer Maternal Grandmother   . COPD Maternal Grandmother   . Drug abuse  Maternal Grandmother   . Heart disease Maternal Grandmother   . Miscarriages / [place] Mother   . Learning disabilities Sister   . Intellectual disability Sister   . Learning disabilities Brother   . Intellectual disability Brother   . Diabetes Maternal Grandfather   . Cancer Paternal Grandmother   . Cancer Paternal Grandfather   . Heart disease Paternal Grandfather   . Kidney disease Paternal Grandfather   . Hyperlipidemia Sister   . Hypertension Sister    Social History   Social History Narrative  . Not on file    Allergies as of 01/17/2019      Reactions   Neomycin Swelling   Shellfish Allergy Itching, Swelling   Sulfa Antibiotics Swelling      Medication List       Accurate as of January 17, 2019  2:25 PM. If you have any questions, ask your nurse or doctor.        acetaminophen 325 MG tablet Commonly known as: Tylenol Take 2 tablets (650 mg total) by mouth every 4 (four) hours as needed (for pain scale < 4).   dicloxacillin 500 MG capsule Commonly known as: DYNAPEN TAKE 1 CAPSULE BY MOUTH THREE TIMES A DAY FOR 7 DAYS   docusate sodium 100 MG capsule Commonly known as: COLACE Take 100 mg by mouth daily as needed for mild constipation.   ibuprofen 600 MG tablet Commonly known as: ADVIL Take 1 tablet (600 mg total) by mouth every 6 (six) hours.  norethindrone 0.35 MG tablet Commonly known as: MICRONOR Take 1 tablet by mouth daily.   prenatal multivitamin Tabs tablet Take 1 tablet by mouth daily at 12 noon.   witch hazel-glycerin pad Commonly known as: TUCKS Apply 1 application topically as needed for itching.       All past medical history, surgical history, allergies, family history, immunizations andmedications were updated in the EMR today and reviewed under the history and medication portions of their EMR.     No results found.   ROS: 14 pt review of systems performed and negative (unless mentioned in an HPI)  Objective: BP 121/79 (BP  Location: Left Arm, Patient Position: Sitting, Cuff Size: Normal)   Pulse 93   Temp 98.5 F (36.9 C) (Temporal)   Resp 17   Ht 5\' 4"  (1.626 m)   Wt 166 lb 6 oz (75.5 kg)   SpO2 97%   Breastfeeding Yes   BMI 28.56 kg/m  Gen: Afebrile. No acute distress. Nontoxic in appearance, well-developed, well-nourished, pleasant, Caucasian female. HENT: AT. [place]. Bilateral TM visualized and normal in appearance, normal external auditory canal. MMM, no oral lesions, adequate dentition. Bilateral nares within normal limits. Throat without erythema, ulcerations or exudates.  No cough on exam, no hoarseness on exam. Eyes:Pupils Equal Round Reactive to light, Extraocular movements intact,  Conjunctiva without redness, discharge or icterus. Neck/lymp/endocrine: Supple, no lymphadenopathy, no thyromegaly CV: RRR no murmur, no edema, +2/4 P posterior tibialis pulses.  No carotid bruits. No JVD. Chest: CTAB, no wheeze, rhonchi or crackles.  Normal respiratory effort.  Good air movement. Abd: Soft.  Flat. NTND. BS present.  No masses palpated. No hepatosplenomegaly. No rebound tenderness or guarding. Skin: No rashes, purpura or petechiae. Warm and well-perfused. Skin intact. Neuro/Msk:  Normal gait. PERLA. EOMi. Alert. Oriented x3.  Cranial nerves II through XII intact. Muscle strength 5/5 upper/lower extremity. DTRs equal bilaterally. Psych: Normal affect, dress and demeanor. Normal speech. Normal thought content and judgment.   Assessment/plan: YOANA STAIB is a 23 y.o. female present for est/CPE Encounter for preventive health examination Patient was encouraged to exercise greater than 150 minutes a week. Patient was encouraged to choose a diet filled with fresh fruits and vegetables, and lean meats. AVS provided to patient today for education/recommendation on gender specific health and safety maintenance. Colonoscopy: no fhx. Routine screen. Mammogram: FHx PGM after 60.routine screen at 40 Cervical  cancer screening: last pap: 01/17/2019 Dr. Perlie Gold Immunizations: tdap 2020, Influenza  (encouraged yearly) Infectious disease screening: HIV completed DEXA: N/A  Diabetes mellitus screening - Hemoglobin A1c Screening cholesterol level - Lipid panel Low hemoglobin - s/p delivery 6 weeks ago, ensure she has returned to baseline.  - CBC with Differential/Platelet    Return in about 1 year (around 01/17/2020) for CPE (30 min).   Note is dictated utilizing voice recognition software. Although note has been proof read prior to signing, occasional typographical errors still can be missed. If any questions arise, please do not hesitate to call for verification.  Electronically signed by: Howard Pouch, DO [place]

## 2019-01-18 ENCOUNTER — Encounter: Payer: Self-pay | Admitting: Family Medicine

## 2019-01-18 LAB — HEMOGLOBIN A1C
Hgb A1c MFr Bld: 4.8 % of total Hgb (ref <5.7)
Mean Plasma Glucose: 91 (calc)
eAG (mmol/L): 5 (calc)

## 2019-01-18 LAB — CBC WITH DIFFERENTIAL/PLATELET
Absolute Monocytes: 640 cells/uL (ref 200–950)
Basophils Absolute: 116 cells/uL (ref 0–200)
Basophils Relative: 1.2 %
Eosinophils Absolute: 330 cells/uL (ref 15–500)
Eosinophils Relative: 3.4 %
HCT: 40.4 % (ref 35.0–45.0)
Hemoglobin: 13.5 g/dL (ref 11.7–15.5)
Lymphs Abs: 3153 cells/uL (ref 850–3900)
MCH: 29.1 pg (ref 27.0–33.0)
MCHC: 33.4 g/dL (ref 32.0–36.0)
MCV: 87.1 fL (ref 80.0–100.0)
MPV: 10.8 fL (ref 7.5–12.5)
Monocytes Relative: 6.6 %
Neutro Abs: 5461 cells/uL (ref 1500–7800)
Neutrophils Relative %: 56.3 %
Platelets: 337 10*3/uL (ref 140–400)
RBC: 4.64 10*6/uL (ref 3.80–5.10)
RDW: 12.5 % (ref 11.0–15.0)
Total Lymphocyte: 32.5 %
WBC: 9.7 10*3/uL (ref 3.8–10.8)

## 2019-01-18 LAB — LIPID PANEL
Cholesterol: 235 mg/dL — ABNORMAL HIGH (ref <200)
HDL: 79 mg/dL (ref >=50)
LDL Cholesterol (Calc): 141 mg/dL (calc) — ABNORMAL HIGH
Non-HDL Cholesterol (Calc): 156 mg/dL (calc) — ABNORMAL HIGH (ref <130)
Total CHOL/HDL Ratio: 3 (calc) (ref <5.0)
Triglycerides: 63 mg/dL (ref <150)

## 2019-01-22 ENCOUNTER — Telehealth (HOSPITAL_COMMUNITY): Payer: Self-pay | Admitting: Lactation Services

## 2019-01-22 NOTE — Telephone Encounter (Signed)
Melissa Ashley called regarding post oral revision stretching.  Talyssa is still concerned about Charlotte's ability to transfer milk at the breast and she is having difficulty with the suck training.  Called Io and recommended she speak to [place].  Message sent to [facility] RN IBCLC in Clinic.

## 2019-01-23 LAB — HM PAP SMEAR

## 2019-02-16 MED FILL — NORLYDA 0.35 MG TABS: 0.35 | 28 days supply | Qty: 28 | Fill #1

## 2019-03-15 MED FILL — NORLYDA 0.35 MG TABS: 0.35 | 84 days supply | Qty: 84 | Fill #2

## 2019-04-16 ENCOUNTER — Other Ambulatory Visit: Payer: Self-pay | Admitting: Obstetrics and Gynecology

## 2019-04-16 DIAGNOSIS — N6320 Unspecified lump in the left breast, unspecified quadrant: Secondary | ICD-10-CM | POA: Diagnosis not present

## 2019-04-19 ENCOUNTER — Ambulatory Visit
Admission: RE | Admit: 2019-04-19 | Discharge: 2019-04-19 | Disposition: A | Discharge status: Home / Self Care | Payer: 59 | Source: Ambulatory Visit | Attending: Obstetrics and Gynecology | Admitting: Obstetrics and Gynecology

## 2019-04-19 ENCOUNTER — Other Ambulatory Visit: Payer: Self-pay

## 2019-04-19 ENCOUNTER — Other Ambulatory Visit: Payer: Self-pay | Admitting: Obstetrics and Gynecology

## 2019-04-19 DIAGNOSIS — N6320 Unspecified lump in the left breast, unspecified quadrant: Secondary | ICD-10-CM

## 2019-04-19 DIAGNOSIS — N6322 Unspecified lump in the left breast, upper inner quadrant: Secondary | ICD-10-CM | POA: Diagnosis not present

## 2019-04-23 ENCOUNTER — Other Ambulatory Visit: Payer: Self-pay

## 2019-04-23 ENCOUNTER — Ambulatory Visit
Admission: RE | Admit: 2019-04-23 | Discharge: 2019-04-23 | Disposition: A | Discharge status: Home / Self Care | Payer: 59 | Source: Ambulatory Visit | Attending: Obstetrics and Gynecology | Admitting: Obstetrics and Gynecology

## 2019-04-23 DIAGNOSIS — N6322 Unspecified lump in the left breast, upper inner quadrant: Secondary | ICD-10-CM | POA: Diagnosis not present

## 2019-04-23 DIAGNOSIS — N6012 Diffuse cystic mastopathy of left breast: Secondary | ICD-10-CM | POA: Diagnosis not present

## 2019-04-23 DIAGNOSIS — N6320 Unspecified lump in the left breast, unspecified quadrant: Secondary | ICD-10-CM

## 2019-04-23 HISTORY — PX: BREAST BIOPSY: SHX20

## 2019-06-07 MED FILL — NORLYDA 0.35 MG TABS: 0.35 | 84 days supply | Qty: 84 | Fill #3

## 2019-07-08 ENCOUNTER — Emergency Department
Admission: EM | Admit: 2019-07-08 | Discharge: 2019-07-08 | Disposition: A | Discharge status: Home / Self Care | Payer: 59 | Source: Home / Self Care | Attending: Family Medicine | Admitting: Family Medicine

## 2019-07-08 ENCOUNTER — Other Ambulatory Visit: Payer: Self-pay

## 2019-07-08 DIAGNOSIS — Z9189 Other specified personal risk factors, not elsewhere classified: Secondary | ICD-10-CM | POA: Diagnosis not present

## 2019-07-08 DIAGNOSIS — J029 Acute pharyngitis, unspecified: Secondary | ICD-10-CM

## 2019-07-08 NOTE — Discharge Instructions (Addendum)
Try warm salt water gargles for sore throat.  May take Ibuprofen 200mg , 3 or 4 tabs every 8 hours with food for sore throat.  Isolate yourself until COVID-19 test result is available.   If your COVID19 test is positive, then you are infected with the novel coronavirus and could give the virus to others.  Please continue isolation at home for at least 10 days since the start of your symptoms.   Once you complete your 10 day quarantine, you may return to normal activities as long as you've not had a fever for over 24 hours (without taking fever reducing medicine) and your symptoms are improving. Please continue good preventive care measures, including:  frequent hand-washing, avoid touching your face, cover coughs/sneezes, stay out of crowds and keep a 6 foot distance from others.  Go to the nearest hospital emergency room if fever/cough/breathlessness are severe or illness seems like a threat to life.   Increase Vitamin D3 to 5000 units daily, and Vitamin C 500mg  twice daily.

## 2019-07-08 NOTE — ED Triage Notes (Signed)
Pt c/o sore throat since late Wednesday. Hx of strep as child. Had tonsils removed at age 24.

## 2019-07-09 LAB — STREP A DNA PROBE: Group A Strep Probe: NOT DETECTED

## 2019-07-10 LAB — SARS-COV-2 RNA,(COVID-19) QUALITATIVE NAAT: SARS CoV2 RNA: NOT DETECTED

## 2019-07-14 NOTE — ED Provider Notes (Signed)
[name]    CSN: [id number] Arrival date & time: 07/08/19  1031      History   Chief Complaint Chief Complaint  Patient presents with  . Sore Throat    HPI Melissa Ashley is a 24 y.o. female.   Patient complains of sore throat for four days with minimal other symptoms.  The history is provided by the patient.    Past Medical History:  Diagnosis Date  . Mitral valve prolapse    asymptomatic  . Scoliosis    pt has xray pic from few years ago    Patient Active Problem List   Diagnosis Date Noted  . Overweight (BMI 25.0-29.9) 01/17/2019    Past Surgical History:  Procedure Laterality Date  . TONSILLECTOMY  2004   hemorrhaged    OB History    Gravida  1   Para  1   Term  1   Preterm      AB      Living  1     SAB      TAB      Ectopic      Multiple  0   Live Births  1            Home Medications    Prior to Admission medications   Medication Sig Start Date End Date Taking? Authorizing Provider  norethindrone (MICRONOR) 0.35 MG tablet Take 1 tablet by mouth daily.    [provider]  Prenatal Vit-Fe Fumarate-FA (PRENATAL MULTIVITAMIN) TABS tablet Take 1 tablet by mouth daily at 12 noon.    [provider]    Family History Family History  Problem Relation Age of Onset  . Hypertension Father   . COPD Maternal Grandmother   . Drug abuse Maternal Grandmother   . Heart disease Maternal Grandmother   . Uterine cancer Maternal Grandmother   . Miscarriages / [place] Mother   . Learning disabilities Sister   . Intellectual disability Sister   . Learning disabilities Brother   . Intellectual disability Brother   . Diabetes Maternal Grandfather   . Breast cancer Paternal Grandmother 59  . Heart disease Paternal Grandfather   . Kidney disease Paternal Grandfather   . Prostate cancer Paternal Grandfather   . Hyperlipidemia Sister   . Hypertension Sister     Social History Social History    Tobacco Use  . Smoking status: Never Smoker  . Smokeless tobacco: Never Used  Substance Use Topics  . Alcohol use: Never  . Drug use: Never     Allergies   Latex, Neomycin, Shellfish allergy, and Sulfa antibiotics   Review of Systems Review of Systems + sore throat No cough No pleuritic pain No wheezing No nasal congestion ? post-nasal drainage No sinus pain/pressure No itchy/red eyes ? earache No hemoptysis No SOB No fever/chills No nausea No vomiting No abdominal pain No diarrhea No urinary symptoms No skin rash No fatigue No myalgias No headache   Physical Exam Triage Vital Signs ED Triage Vitals  Enc Vitals Group     BP 07/08/19 1241 132/88     Pulse Rate 07/08/19 1241 88     Resp 07/08/19 1241 18     Temp 07/08/19 1241 98.3 F (36.8 C)     Temp Source 07/08/19 1241 Oral     SpO2 07/08/19 1241 97 %     Weight 07/08/19 1242 176 lb (79.8 kg)     Height 07/08/19 1242 5\' 4"  (1.626 m)  Head Circumference --      Peak Flow --      Pain Score 07/08/19 1242 0     Pain Loc --      Pain Edu? --      Excl. in [place]? --    No data found.  Updated Vital Signs BP 132/88 (BP Location: Right Arm)   Pulse 88   Temp 98.3 F (36.8 C) (Oral)   Resp 18   Ht 5\' 4"  (1.626 m)   Wt 79.8 kg   LMP  (LMP Unknown)   SpO2 97%   BMI 30.21 kg/m   Visual Acuity Right Eye Distance:   Left Eye Distance:   Bilateral Distance:    Right Eye Near:   Left Eye Near:    Bilateral Near:     Physical Exam Nursing notes and Vital Signs reviewed. Appearance:  Patient appears stated age, and in no acute distress Eyes:  Pupils are equal, round, and reactive to light and accomodation.  Extraocular movement is intact.  Conjunctivae are not inflamed  Ears:  Canals normal.  Tympanic membranes normal.  Nose:  Mildly congested turbinates.  No sinus tenderness.  Pharynx:   Minimal erythema. Neck:  Supple.  Mildly enlarged lateral nodes are present, tender to palpation on the  left.   Lungs:  Clear to auscultation.  Breath sounds are equal.  Moving air well. Heart:  Regular rate and rhythm without murmurs, rubs, or gallops.  Abdomen:  Nontender without masses or hepatosplenomegaly.  Bowel sounds are present.  No CVA or flank tenderness.  Extremities:  No edema.  Skin:  No rash present.   UC Treatments / Results  Labs (all labs ordered are listed, but only abnormal results are displayed) Labs Reviewed  SARS-COV-2 RNA,(COVID-19) QUALITATIVE NAAT  STREP A DNA PROBE    EKG   Radiology No results found.  Procedures Procedures (including critical care time)  Medications Ordered in UC Medications - No data to display  Initial Impression / Assessment and Plan / UC Course  I have reviewed the triage vital signs and the nursing notes.  Pertinent labs & imaging results that were available during my care of the patient were reviewed by me and considered in my medical decision making (see chart for details).    COVID19 send out   Final Clinical Impressions(s) / UC Diagnoses   Final diagnoses:  Acute pharyngitis, unspecified etiology  At increased risk of exposure to COVID-19 virus     Discharge Instructions     Try warm salt water gargles for sore throat.  May take Ibuprofen 200mg , 3 or 4 tabs every 8 hours with food for sore throat.  Isolate yourself until COVID-19 test result is available.   If your COVID19 test is positive, then you are infected with the novel coronavirus and could give the virus to others.  Please continue isolation at home for at least 10 days since the start of your symptoms.   Once you complete your 10 day quarantine, you may return to normal activities as long as you've not had a fever for over 24 hours (without taking fever reducing medicine) and your symptoms are improving. Please continue good preventive care measures, including:  frequent hand-washing, avoid touching your face, cover coughs/sneezes, stay out of crowds and  keep a 6 foot distance from others.  Go to the nearest hospital emergency room if fever/cough/breathlessness are severe or illness seems like a threat to life.   Increase Vitamin D3 to 5000  units daily, and Vitamin C 500mg  twice daily.    ED Prescriptions    None        Kandra Nicolas, MD 07/14/19 1254

## 2019-08-29 MED FILL — TRI-SPRINTEC TABLET: 0.18/0.215/ | 84 days supply | Qty: 84 | Fill #0

## 2019-11-21 MED FILL — TRI-SPRINTEC TABLET: 0.18/0.215/ | 84 days supply | Qty: 84 | Fill #1

## 2020-01-03 ENCOUNTER — Encounter: Payer: Self-pay | Admitting: Family Medicine

## 2020-01-03 ENCOUNTER — Other Ambulatory Visit: Payer: Self-pay

## 2020-01-03 ENCOUNTER — Ambulatory Visit (INDEPENDENT_AMBULATORY_CARE_PROVIDER_SITE_OTHER): Payer: 59 | Admitting: Family Medicine

## 2020-01-03 VITALS — BP 137/89 | HR 66 | Temp 98.0°F | Resp 18 | Ht 64.75 in | Wt 184.4 lb

## 2020-01-03 DIAGNOSIS — Z793 Long term (current) use of hormonal contraceptives: Secondary | ICD-10-CM | POA: Insufficient documentation

## 2020-01-03 DIAGNOSIS — Z0000 Encounter for general adult medical examination without abnormal findings: Secondary | ICD-10-CM | POA: Diagnosis not present

## 2020-01-03 DIAGNOSIS — Z131 Encounter for screening for diabetes mellitus: Secondary | ICD-10-CM

## 2020-01-03 DIAGNOSIS — Z130 Encounter for screening for diseases of the blood and blood-forming organs and certain disorders involving the immune mechanism: Secondary | ICD-10-CM | POA: Diagnosis not present

## 2020-01-03 DIAGNOSIS — E669 Obesity, unspecified: Secondary | ICD-10-CM | POA: Insufficient documentation

## 2020-01-03 NOTE — Patient Instructions (Addendum)
Health Maintenance, Female Adopting a healthy lifestyle and getting preventive care are important in promoting health and wellness. Ask your health care provider about:  The right schedule for you to have regular tests and exams.  Things you can do on your own to prevent diseases and keep yourself healthy. What should I know about diet, weight, and exercise? Eat a healthy diet   Eat a diet that includes plenty of vegetables, fruits, low-fat dairy products, and lean protein.  Do not eat a lot of foods that are high in solid fats, added sugars, or sodium. Maintain a healthy weight Body mass index (BMI) is used to identify weight problems. It estimates body fat based on height and weight. Your health care provider can help determine your BMI and help you achieve or maintain a healthy weight. Get regular exercise Get regular exercise. This is one of the most important things you can do for your health. Most adults should:  Exercise for at least 150 minutes each week. The exercise should increase your heart rate and make you sweat (moderate-intensity exercise).  Do strengthening exercises at least twice a week. This is in addition to the moderate-intensity exercise.  Spend less time sitting. Even light physical activity can be beneficial. Watch cholesterol and blood lipids Have your blood tested for lipids and cholesterol at 25 years of age, then have this test every 5 years. Have your cholesterol levels checked more often if:  Your lipid or cholesterol levels are high.  You are older than 25 years of age.  You are at high risk for heart disease. What should I know about cancer screening? Depending on your health history and family history, you may need to have cancer screening at various ages. This may include screening for:  Breast cancer.  Cervical cancer.  Colorectal cancer.  Skin cancer.  Lung cancer. What should I know about heart disease, diabetes, and high blood  pressure? Blood pressure and heart disease  High blood pressure causes heart disease and increases the risk of stroke. This is more likely to develop in people who have high blood pressure readings, are of African descent, or are overweight.  Have your blood pressure checked: ? Every 3-5 years if you are 25-39 years of age. ? Every year if you are 40 years old or older. Diabetes Have regular diabetes screenings. This checks your fasting blood sugar level. Have the screening done:  Once every three years after age 25 if you are at a normal weight and have a low risk for diabetes.  More often and at a younger age if you are overweight or have a high risk for diabetes. What should I know about preventing infection? Hepatitis B If you have a higher risk for hepatitis B, you should be screened for this virus. Talk with your health care provider to find out if you are at risk for hepatitis B infection. Hepatitis C Testing is recommended for:  Everyone born from 1945 through 1965.  Anyone with known risk factors for hepatitis C. Sexually transmitted infections (STIs)  Get screened for STIs, including gonorrhea and chlamydia, if: ? You are sexually active and are younger than 24 years of age. ? You are older than 24 years of age and your health care provider tells you that you are at risk for this type of infection. ? Your sexual activity has changed since you were last screened, and you are at increased risk for chlamydia or gonorrhea. Ask your health care provider if   you are at risk.  Ask your health care provider about whether you are at high risk for HIV. Your health care provider may recommend a prescription medicine to help prevent HIV infection. If you choose to take medicine to prevent HIV, you should first get tested for HIV. You should then be tested every 3 months for as long as you are taking the medicine. Pregnancy  If you are about to stop having your period (premenopausal) and  you may become pregnant, seek counseling before you get pregnant.  Take 400 to 800 micrograms (mcg) of folic acid every day if you become pregnant.  Ask for birth control (contraception) if you want to prevent pregnancy. Osteoporosis and menopause Osteoporosis is a disease in which the bones lose minerals and strength with aging. This can result in bone fractures. If you are 7 years old or older, or if you are at risk for osteoporosis and fractures, ask your health care provider if you should:  Be screened for bone loss.  Take a calcium or vitamin D supplement to lower your risk of fractures.  Be given hormone replacement therapy (HRT) to treat symptoms of menopause. Follow these instructions at home: Lifestyle  Do not use any products that contain nicotine or tobacco, such as cigarettes, e-cigarettes, and chewing tobacco. If you need help quitting, ask your health care provider.  Do not use street drugs.  Do not share needles.  Ask your health care provider for help if you need support or information about quitting drugs. Alcohol use  Do not drink alcohol if: ? Your health care provider tells you not to drink. ? You are pregnant, may be pregnant, or are planning to become pregnant.  If you drink alcohol: ? Limit how much you use to 0-1 drink a day. ? Limit intake if you are breastfeeding.  Be aware of how much alcohol is in your drink. In the [place] one drink equals one 12 oz bottle of beer (355 mL), one 5 oz glass of wine (148 mL), or one 1 oz glass of hard liquor (44 mL). General instructions  Schedule regular health, dental, and eye exams.  Stay current with your vaccines.  Tell your health care provider if: ? You often feel depressed. ? You have ever been abused or do not feel safe at home. Summary  Adopting a healthy lifestyle and getting preventive care are important in promoting health and wellness.  Follow your health care provider's instructions about healthy  diet, exercising, and getting tested or screened for diseases.  Follow your health care provider's instructions on monitoring your cholesterol and blood pressure. This information is not intended to replace advice given to you by your health care provider. Make sure you discuss any questions you have with your health care provider. Document Revised: 06/14/2018 Document Reviewed: 06/14/2018 Elsevier Patient Education  [place] BMI today: Body mass index is 30.92 kg/m.    BMI for Adults What is BMI? Body mass index (BMI) is a number that is calculated from a person's weight and height. BMI can help estimate how much of a person's weight is composed of fat. BMI does not measure body fat directly. Rather, it is an alternative to procedures that directly measure body fat, which can be difficult and expensive. BMI can help identify people who may be at higher risk for certain medical problems. What are BMI measurements used for? BMI is used as a screening tool to identify possible weight problems. It helps  determine whether a person is obese, overweight, a healthy weight, or underweight. BMI is useful for:  Identifying a weight problem that may be related to a medical condition or may increase the risk for medical problems.  Promoting changes, such as changes in diet and exercise, to help reach a healthy weight. BMI screening can be repeated to see if these changes are working. How is BMI calculated? BMI involves measuring your weight in relation to your height. Both height and weight are measured, and the BMI is calculated from those numbers. This can be done either in [place] ([place] or metric measurements. Note that charts and online BMI calculators are available to help you find your BMI quickly and easily without having to do these calculations yourself. To calculate your BMI in [place] ([place] measurements:  1. Measure your weight in pounds (lb). 2. Multiply the number of  pounds by 703. ? For example, for a person who weighs 180 lb, multiply that number by 703, which equals 126,540. 3. Measure your height in inches. Then multiply that number by itself to get a measurement called "inches squared." ? For example, for a person who is 70 inches tall, the "inches squared" measurement is 70 inches x 70 inches, which equals 4,900 inches squared. 4. Divide the total from step 2 (number of lb x 703) by the total from step 3 (inches squared): 126,540  4,900 = 25.8. This is your BMI. To calculate your BMI in metric measurements: 1. Measure your weight in kilograms (kg). 2. Measure your height in meters (m). Then multiply that number by itself to get a measurement called "meters squared." ? For example, for a person who is 1.75 m tall, the "meters squared" measurement is 1.75 m x 1.75 m, which is equal to 3.1 meters squared. 3. Divide the number of kilograms (your weight) by the meters squared number. In this example: 70  3.1 = 22.6. This is your BMI. What do the results mean? BMI charts are used to identify whether you are underweight, normal weight, overweight, or obese. The following guidelines will be used:  Underweight: BMI less than 18.5.  Normal weight: BMI between 18.5 and 24.9.  Overweight: BMI between 25 and 29.9.  Obese: BMI of 30 or above. Keep these notes in mind:  Weight includes both fat and muscle, so someone with a muscular build, such as an athlete, may have a BMI that is higher than 24.9. In cases like these, BMI is not an accurate measure of body fat.  To determine if excess body fat is the cause of a BMI of 25 or higher, further assessments may need to be done by a health care provider.  BMI is usually interpreted in the same way for men and women. Where to find more information For more information about BMI, including tools to quickly calculate your BMI, go to these websites:  Centers for Disease Control and Prevention:  http://www.wolf.info/  American Heart Association: www.heart.org  National Heart, Lung, and Blood Institute: https://wilson-eaton.com/ Summary  Body mass index (BMI) is a number that is calculated from a person's weight and height.  BMI may help estimate how much of a person's weight is composed of fat. BMI can help identify those who may be at higher risk for certain medical problems.  BMI can be measured using English measurements or metric measurements.  BMI charts are used to identify whether you are underweight, normal weight, overweight, or obese. This information is not intended to replace advice given  to you by your health care provider. Make sure you discuss any questions you have with your health care provider. Document Revised: 03/14/2019 Document Reviewed: 01/19/2019 Elsevier Patient Education  [place].

## 2020-01-03 NOTE — Progress Notes (Signed)
This visit occurred during the SARS-CoV-2 public health emergency.  Safety protocols were in place, including screening questions prior to the visit, additional usage of staff PPE, and extensive cleaning of exam room while observing appropriate contact time as indicated for disinfecting solutions.    Patient ID: Melissa Ashley, female  DOB: Sep 12, 1995, 24 y.o.   MRN: [id number] Patient Care Team    Relationship Specialty Notifications Start End  Ma Hillock, DO PCP - General Family Medicine  01/17/19   Cheri Fowler, MD Consulting Physician Obstetrics and Gynecology  01/17/19     Chief Complaint  Patient presents with  . Annual Exam    Fasting. Pap smear- 01/17/2019.     Subjective:  Melissa Ashley is a 24 y.o.  Female  present for CPE. All past medical history, surgical history, allergies, family history, immunizations, medications and social history were updated in the electronic medical record today. All recent labs, ED visits and hospitalizations within the last year were reviewed.  Health maintenance:  Colonoscopy: no fhx. Routine screen at 4 Mammogram: FHx PGM after 60.routine screen at 40 Cervical cancer screening: last pap: 01/17/2019 Dr. Perlie Gold Immunizations: tdap 2020, Influenza  (encouraged yearly), Covid vaccine series completed.  Infectious disease screening: HIV completed DEXA: N/A Assistive device: none Oxygen RRN:HAFB Patient has a Dental home. Hospitalizations/ED visits: reviewed  Depression screen [facility] 2/9 01/03/2020 01/17/2019  Decreased Interest 0 0  Down, Depressed, Hopeless 0 0  PHQ - 2 Score 0 0   No flowsheet data found.  Immunization History  Administered Date(s) Administered  . DTaP 10/29/1994, 12/31/1994, 03/14/1995, 09/06/1995  . Hepatitis A 10/07/2005, 10/04/2006  . Hepatitis B 20-Feb-1995, 09/30/1994, 03/14/1995  . HiB (PRP-OMP) 10/29/1994, 12/31/1994, 03/14/1995, 09/06/1995  . IPV 10/29/1994, 12/31/1994, 09/10/1999  . MMR  09/06/1995, 09/10/1999  . Meningococcal Conjugate 02/09/2011  . Meningococcal Polysaccharide 10/04/2006  . PFIZER SARS-COV-2 Vaccination 09/14/2019, 10/05/2019  . Tdap 07/29/2015  . Varicella 09/06/1995, 10/07/2005     Past Medical History:  Diagnosis Date  . Mitral valve prolapse    asymptomatic  . Scoliosis    pt has xray pic from few years ago   Allergies  Allergen Reactions  . Latex   . Neomycin Swelling  . Shellfish Allergy Itching and Swelling  . Sulfa Antibiotics Swelling   Past Surgical History:  Procedure Laterality Date  . BREAST BIOPSY Left 04/23/2019   benign- ruptured cyst and density  . TONSILLECTOMY  2004   hemorrhaged   Family History  Problem Relation Age of Onset  . Hypertension Father   . COPD Maternal Grandmother   . Drug abuse Maternal Grandmother   . Heart disease Maternal Grandmother   . Uterine cancer Maternal Grandmother   . Miscarriages / [place] Mother   . Learning disabilities Sister   . Intellectual disability Sister   . Learning disabilities Brother   . Intellectual disability Brother   . Diabetes Maternal Grandfather   . Breast cancer Paternal Grandmother 4  . Heart disease Paternal Grandfather   . Kidney disease Paternal Grandfather   . Prostate cancer Paternal Grandfather   . Hyperlipidemia Sister   . Hypertension Sister    Social History   Social History Narrative   Marital status/children/pets: married. 1 daughter Baldo Ash)   Education/employment: BSN-RN   Safety:      -Wears a bicycle helmet riding a bike: Yes     -smoke alarm in the home:Yes     - wears seatbelt: Yes     -  Feels safe in their relationships: Yes    Allergies as of 01/03/2020      Reactions   Latex    Neomycin Swelling   Shellfish Allergy Itching, Swelling   Sulfa Antibiotics Swelling      Medication List       Accurate as of January 03, 2020  9:50 AM. If you have any questions, ask your nurse or doctor.        STOP taking these  medications   norethindrone 0.35 MG tablet Commonly known as: MICRONOR Stopped by: Howard Pouch, DO   prenatal multivitamin Tabs tablet Stopped by: Howard Pouch, DO     TAKE these medications   norethindrone-ethinyl estradiol 0.5/0.75/1-35 MG-MCG tablet Commonly known as: CYCLAFEM Take 1 tablet by mouth daily.       All past medical history, surgical history, allergies, family history, immunizations andmedications were updated in the EMR today and reviewed under the history and medication portions of their EMR.     No results found for this or any previous visit (from the past 2160 hour(s)).  No results found.   ROS: 14 pt review of systems performed and negative (unless mentioned in an HPI)  Objective: BP 137/89 (BP Location: Left Arm, Patient Position: Sitting, Cuff Size: Normal)   Pulse 66   Temp 98 F (36.7 C) (Temporal)   Resp 18   Ht 5' 4.75" (1.645 m)   Wt 184 lb 6 oz (83.6 kg)   LMP 12/18/2019 (Exact Date)   SpO2 100%   Breastfeeding No   BMI 30.92 kg/m  Gen: Afebrile. No acute distress. Nontoxic in appearance, well-developed, well-nourished,  Pleasant female HENT: AT. [place]. Bilateral TM visualized and normal in appearance, normal external auditory canal. MMM, no oral lesions, adequate dentition. Bilateral nares within normal limits. Throat without erythema, ulcerations or exudates. no Cough on exam, no hoarseness on exam. Eyes:Pupils Equal Round Reactive to light, Extraocular movements intact,  Conjunctiva without redness, discharge or icterus. Neck/lymp/endocrine: Supple,no lymphadenopathy, no thyromegaly CV: RRR no murmur, no edema, +2/4 P posterior tibialis pulses.  Chest: CTAB, no wheeze, rhonchi or crackles. Normal  Respiratory effort. good Air movement. Abd: Soft. flat. NTND. BS present. no Masses palpated. No hepatosplenomegaly. No rebound tenderness or guarding. Skin: no rashes, purpura or petechiae. Warm and well-perfused. Skin intact. Neuro/Msk:  Normal  gait. PERLA. EOMi. Alert. Oriented x3.  Cranial nerves II through XII intact. Muscle strength 5/5 upper/lower extremity. DTRs equal bilaterally. Psych: Normal affect, dress and demeanor. Normal speech. Normal thought content and judgment.   No exam data present  Assessment/plan: ANDREIA GANDOLFI is a 24 y.o. female present for CPE Long term current use of hormonal contraceptive - Lipid panel - BCP supplied by GYN Hypocalcemia Not taking supplement  At this time. Low level was during pregnancy.  - Comp Met (CMET) - Vitamin D (25 hydroxy) Obesity (BMI 30-39.9) - Lipid panel - counseling provided today on exercise and dietary changes.Her BP is borderline today. She was encourage to make lifestyle changes and monitor a few times a week. Proper BP technique was discussed. If BP remains > 135/85 routinely> she is aware to make an appt to discuss.  Diabetes mellitus screening - Hemoglobin A1c Screening for deficiency anemia - CBC Encounter for preventive health examination Patient was encouraged to exercise greater than 150 minutes a week. Patient was encouraged to choose a diet filled with fresh fruits and vegetables, and lean meats. AVS provided to patient today for education/recommendation on gender specific health  and safety maintenance. Colonoscopy: no fhx. Routine screen at 52 Mammogram: FHx PGM after 60.routine screen at 40 Cervical cancer screening: last pap: 01/17/2019 Dr. Perlie Gold Immunizations: tdap 2020, Influenza  (encouraged yearly), Covid vaccine completed  Infectious disease screening: HIV completed DEXA: N/A  Return in about 1 year (around 01/02/2021) for CPE (30 min).   Orders Placed This Encounter  Procedures  . Comp Met (CMET)  . Vitamin D (25 hydroxy)  . Lipid panel  . Hemoglobin A1c  . CBC   No orders of the defined types were placed in this encounter.  Referral Orders  No referral(s) requested today     Electronically signed by: Howard Pouch,  [place]

## 2020-01-04 LAB — CBC
HCT: 43 % (ref 36.0–46.0)
Hemoglobin: 14.1 g/dL (ref 12.0–15.0)
MCHC: 32.8 g/dL (ref 30.0–36.0)
MCV: 85.6 fl (ref 78.0–100.0)
Platelets: 260 10*3/uL (ref 150.0–400.0)
RBC: 5.02 Mil/uL (ref 3.87–5.11)
RDW: 13.9 % (ref 11.5–15.5)
WBC: 7 10*3/uL (ref 4.0–10.5)

## 2020-01-04 LAB — COMPREHENSIVE METABOLIC PANEL
ALT: 8 U/L (ref 0–35)
AST: 15 U/L (ref 0–37)
Albumin: 4.8 g/dL (ref 3.5–5.2)
Alkaline Phosphatase: 72 U/L (ref 39–117)
BUN: 10 mg/dL (ref 6–23)
CO2: 22 mEq/L (ref 19–32)
Calcium: 9.3 mg/dL (ref 8.4–10.5)
Chloride: 102 mEq/L (ref 96–112)
Creatinine, Ser: 0.6 mg/dL (ref 0.40–1.20)
GFR: 121.47 mL/min (ref >60.00)
Glucose, Bld: 88 mg/dL (ref 70–99)
Potassium: 4.2 mEq/L (ref 3.5–5.1)
Sodium: 137 mEq/L (ref 135–145)
Total Bilirubin: 0.6 mg/dL (ref 0.2–1.2)
Total Protein: 7.4 g/dL (ref 6.0–8.3)

## 2020-01-04 LAB — VITAMIN D 25 HYDROXY (VIT D DEFICIENCY, FRACTURES): VITD: 26.83 ng/mL — ABNORMAL LOW (ref 30.00–100.00)

## 2020-01-04 LAB — LIPID PANEL
Cholesterol: 191 mg/dL (ref 0–200)
HDL: 74.6 mg/dL (ref >39.00)
LDL Cholesterol: 91 mg/dL (ref 0–99)
NonHDL: 116.17
Total CHOL/HDL Ratio: 3
Triglycerides: 124 mg/dL (ref 0.0–149.0)
VLDL: 24.8 mg/dL (ref 0.0–40.0)

## 2020-01-04 LAB — HEMOGLOBIN A1C: Hgb A1c MFr Bld: 5.1 % (ref 4.6–6.5)

## 2020-01-21 ENCOUNTER — Other Ambulatory Visit (HOSPITAL_COMMUNITY): Payer: Self-pay | Admitting: Obstetrics and Gynecology

## 2020-01-21 DIAGNOSIS — Z3041 Encounter for surveillance of contraceptive pills: Secondary | ICD-10-CM | POA: Diagnosis not present

## 2020-01-21 DIAGNOSIS — Z6831 Body mass index (BMI) 31.0-31.9, adult: Secondary | ICD-10-CM | POA: Diagnosis not present

## 2020-01-21 DIAGNOSIS — Z01419 Encounter for gynecological examination (general) (routine) without abnormal findings: Secondary | ICD-10-CM | POA: Diagnosis not present

## 2020-01-21 DIAGNOSIS — Z1389 Encounter for screening for other disorder: Secondary | ICD-10-CM | POA: Diagnosis not present

## 2020-01-21 DIAGNOSIS — Z130 Encounter for screening for diseases of the blood and blood-forming organs and certain disorders involving the immune mechanism: Secondary | ICD-10-CM | POA: Diagnosis not present

## 2020-02-01 ENCOUNTER — Encounter: Payer: Self-pay | Admitting: Family Medicine

## 2020-02-15 MED FILL — TRI-SPRINTEC TABLET: 0.18/0.215/ | 84 days supply | Qty: 84 | Fill #0

## 2020-03-05 ENCOUNTER — Encounter: Payer: Self-pay | Admitting: Family Medicine

## 2020-03-05 ENCOUNTER — Other Ambulatory Visit: Payer: Self-pay

## 2020-03-05 ENCOUNTER — Other Ambulatory Visit (HOSPITAL_COMMUNITY)
Admission: RE | Admit: 2020-03-05 | Discharge: 2020-03-05 | Disposition: A | Discharge status: Home / Self Care | Payer: 59 | Source: Ambulatory Visit | Attending: Family Medicine | Admitting: Family Medicine

## 2020-03-05 ENCOUNTER — Ambulatory Visit: Payer: 59 | Admitting: Family Medicine

## 2020-03-05 VITALS — BP 118/70 | HR 82 | Temp 98.1°F | Ht 64.75 in | Wt 184.0 lb

## 2020-03-05 DIAGNOSIS — R300 Dysuria: Secondary | ICD-10-CM | POA: Insufficient documentation

## 2020-03-05 LAB — POCT URINALYSIS DIPSTICK
Bilirubin, UA: NEGATIVE
Blood, UA: NEGATIVE
Glucose, UA: NEGATIVE
Ketones, UA: NEGATIVE
Leukocytes, UA: NEGATIVE
Nitrite, UA: NEGATIVE
Protein, UA: NEGATIVE
Spec Grav, UA: 1.01 (ref 1.010–1.025)
Urobilinogen, UA: 0.2 E.U./dL
pH, UA: 7 (ref 5.0–8.0)

## 2020-03-05 NOTE — Patient Instructions (Signed)

## 2020-03-05 NOTE — Progress Notes (Signed)
Established Patient Office Visit  Subjective:  Patient ID: Melissa Ashley, female    DOB: 1996/04/11  Age: 24 y.o. MRN: [id number]  CC:  Chief Complaint  Patient presents with  . Dysuria  . Back Pain  . Vaginal Discharge    increase in vaginal discharge no color ordor.     HPI Melissa Ashley presents for 1 day history of some urine frequency and mild burning at the end of urination.  She states for about a week now she has had some slightly increased vaginal discharge with slight odor.  This was slightly brownish in color.  She then noted some mild left labial pain yesterday.  She did not see any visible swelling or any rashes.  No abdominal pain.  Denies any flank pain.  No fever or chills.  No history of UTI.  She is monogamous and has no history of STD.  She is on oral contraception and takes regularly.  Menses have been regular.  Past Medical History:  Diagnosis Date  . Mitral valve prolapse    asymptomatic  . Scoliosis    pt has xray pic from few years ago    Past Surgical History:  Procedure Laterality Date  . BREAST BIOPSY Left 04/23/2019   benign- ruptured cyst and density  . TONSILLECTOMY  2004   hemorrhaged    Family History  Problem Relation Age of Onset  . Hypertension Father   . COPD Maternal Grandmother   . Drug abuse Maternal Grandmother   . Heart disease Maternal Grandmother   . Uterine cancer Maternal Grandmother   . Miscarriages / [place] Mother   . Learning disabilities Sister   . Intellectual disability Sister   . Learning disabilities Brother   . Intellectual disability Brother   . Diabetes Maternal Grandfather   . Breast cancer Paternal Grandmother 87  . Heart disease Paternal Grandfather   . Kidney disease Paternal Grandfather   . Prostate cancer Paternal Grandfather   . Hyperlipidemia Sister   . Hypertension Sister     Social History   Socioeconomic History  . Marital status: Married    Spouse name: Not on file  . Number of  children: Not on file  . Years of education: Not on file  . Highest education level: Not on file  Occupational History  . Not on file  Tobacco Use  . Smoking status: Never Smoker  . Smokeless tobacco: Never Used  Vaping Use  . Vaping Use: Never used  Substance and Sexual Activity  . Alcohol use: Never  . Drug use: Never  . Sexual activity: Yes    Partners: Male  Other Topics Concern  . Not on file  Social History Narrative   Marital status/children/pets: married. 1 daughter Baldo Ash)   Education/employment: BSN-RN   Safety:      -Wears a bicycle helmet riding a bike: Yes     -smoke alarm in the home:Yes     - wears seatbelt: Yes     - Feels safe in their relationships: Yes   Social Determinants of Health   Financial Resource Strain:   . Difficulty of Paying Living Expenses: Not on file  Food Insecurity:   . Worried About Charity fundraiser in the Last Year: Not on file  . Ran Out of Food in the Last Year: Not on file  Transportation Needs:   . Lack of Transportation (Medical): Not on file  . Lack of Transportation (Non-Medical): Not on file  Physical  Activity:   . Days of Exercise per Week: Not on file  . Minutes of Exercise per Session: Not on file  Stress:   . Feeling of Stress : Not on file  Social Connections:   . Frequency of Communication with Friends and Family: Not on file  . Frequency of Social Gatherings with Friends and Family: Not on file  . Attends Religious Services: Not on file  . Active Member of Clubs or Organizations: Not on file  . Attends Archivist Meetings: Not on file  . Marital Status: Not on file  Intimate Partner Violence:   . Fear of Current or Ex-Partner: Not on file  . Emotionally Abused: Not on file  . Physically Abused: Not on file  . Sexually Abused: Not on file    Outpatient Medications Prior to Visit  Medication Sig Dispense Refill  . norethindrone-ethinyl estradiol (CYCLAFEM) 0.5/0.75/1-35 MG-MCG tablet Take 1  tablet by mouth daily.     No facility-administered medications prior to visit.    Allergies  Allergen Reactions  . Latex   . Neomycin Swelling  . Shellfish Allergy Itching and Swelling  . Sulfa Antibiotics Swelling    ROS Review of Systems  Constitutional: Negative for chills and fever.  Genitourinary: Positive for dysuria and vaginal discharge. Negative for genital sores, hematuria, pelvic pain and vaginal bleeding.      Objective:    Physical Exam Vitals reviewed.  Constitutional:      Appearance: Normal appearance.  Cardiovascular:     Rate and Rhythm: Normal rate and regular rhythm.  Pulmonary:     Effort: Pulmonary effort is normal.     Breath sounds: Normal breath sounds.  Genitourinary:    Comments: Visual inspection of labia with nurse chaperone present reveals no visible swelling.  No erythema.  No visible vaginal discharge.  No external rashes. Neurological:     Mental Status: She is alert.     BP 118/70   Pulse 82   Temp 98.1 F (36.7 C) (Oral)   Ht 5' 4.75" (1.645 m)   Wt 184 lb (83.5 kg)   LMP 02/10/2020   SpO2 99%   BMI 30.86 kg/m  Wt Readings from Last 3 Encounters:  03/05/20 184 lb (83.5 kg)  01/03/20 184 lb 6 oz (83.6 kg)  07/08/19 176 lb (79.8 kg)     Health Maintenance Due  Topic Date Due  . INFLUENZA VACCINE  Never done    There are no preventive care reminders to display for this patient.  No results found for: TSH Lab Results  Component Value Date   WBC 7.0 01/03/2020   HGB 14.1 01/03/2020   HCT 43.0 01/03/2020   MCV 85.6 01/03/2020   PLT 260.0 01/03/2020   Lab Results  Component Value Date   NA 137 01/03/2020   K 4.2 01/03/2020   CO2 22 01/03/2020   GLUCOSE 88 01/03/2020   BUN 10 01/03/2020   CREATININE 0.60 01/03/2020   BILITOT 0.6 01/03/2020   ALKPHOS 72 01/03/2020   AST 15 01/03/2020   ALT 8 01/03/2020   PROT 7.4 01/03/2020   ALBUMIN 4.8 01/03/2020   CALCIUM 9.3 01/03/2020   ANIONGAP 9 10/24/2018   GFR  121.47 01/03/2020   Lab Results  Component Value Date   CHOL 191 01/03/2020   Lab Results  Component Value Date   HDL 74.60 01/03/2020   Lab Results  Component Value Date   LDLCALC 91 01/03/2020   Lab Results  Component Value  Date   TRIG 124.0 01/03/2020   Lab Results  Component Value Date   CHOLHDL 3 01/03/2020   Lab Results  Component Value Date   HGBA1C 5.1 01/03/2020      Assessment & Plan:   Problem List Items Addressed This Visit    None    Visit Diagnoses    Dysuria    -  Primary   Relevant Orders   POC Urinalysis Dipstick (Completed)   Urine cytology ancillary only    Patient has some mild dysuria of 1 day duration but no evidence of UTI on dipstick.  Dipstick negative for leukocytes, nitrites, and blood.  -Urine was sent for cytology for Gardnerella, chlamydia, GC, trichomonas, candida -Increase hydration -Follow-up for any persistent or worsening symptoms  No orders of the defined types were placed in this encounter.   Follow-up: No follow-ups on file.    Carolann Littler, MD

## 2020-03-06 LAB — URINE CYTOLOGY ANCILLARY ONLY
Bacterial Vaginitis-Urine: NEGATIVE
Candida Urine: NEGATIVE
Chlamydia: NEGATIVE
Comment: NEGATIVE
Comment: NEGATIVE
Comment: NORMAL
Neisseria Gonorrhea: NEGATIVE
Trichomonas: NEGATIVE

## 2020-05-13 MED FILL — TRI FEMYNOR 28 TABLET: 0.18/0.215/ | 84 days supply | Qty: 84 | Fill #1

## 2020-08-04 MED FILL — TRI FEMYNOR 28 TABLET: 0.18/0.215/ | 84 days supply | Qty: 84 | Fill #2

## 2020-10-07 IMAGING — US US BREAST*L* LIMITED INC AXILLA
1 series · 13 of 17 positions shown · non-contrast
Comparison: Previous exam(s).

CLINICAL DATA: 23-year-old breast feeding female presenting for
evaluation of a palpable lump in the left breast. She previously had
Shubha Perlman of mastitis in [REDACTED], and this palpable area was noticed after
she was treated for the mastitis. At some point she noticed that the
lump disappeared, than recurred. She has not currently had any
infectious symptoms.

EXAM:
ULTRASOUND OF THE LEFT BREAST

[Series 1: us breast*left* limited inc axilla · 0.06mm/px · 13 of 17 slices shown]
[im 1/17]
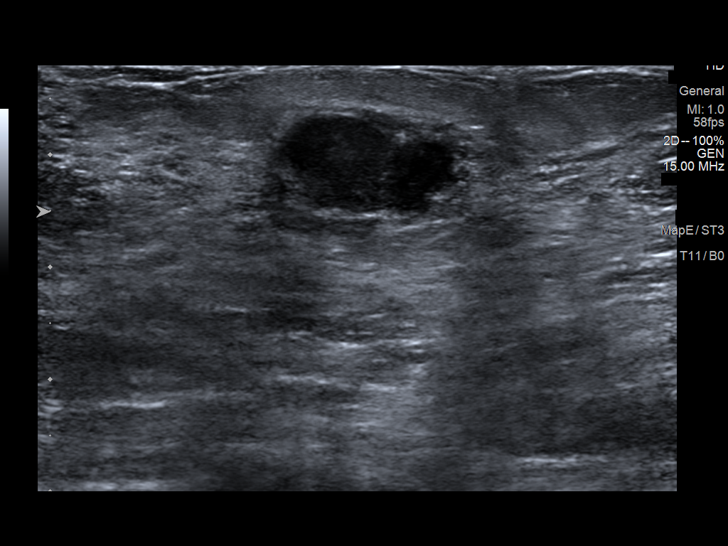
[im 2/17]
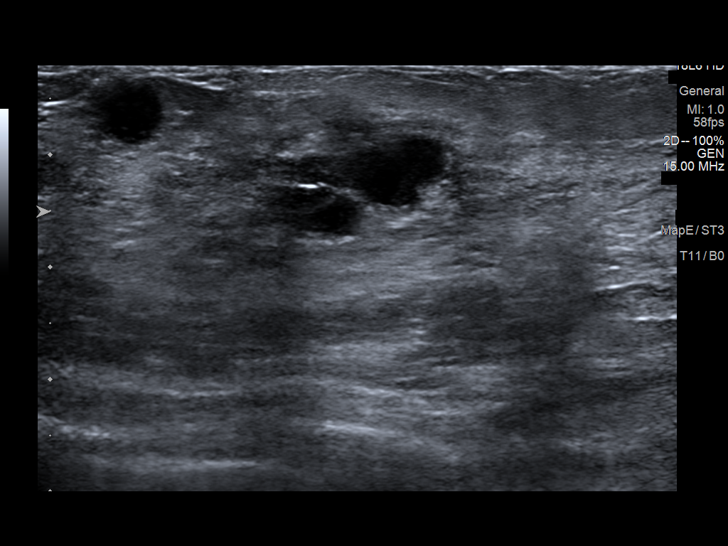
[im 4/17]
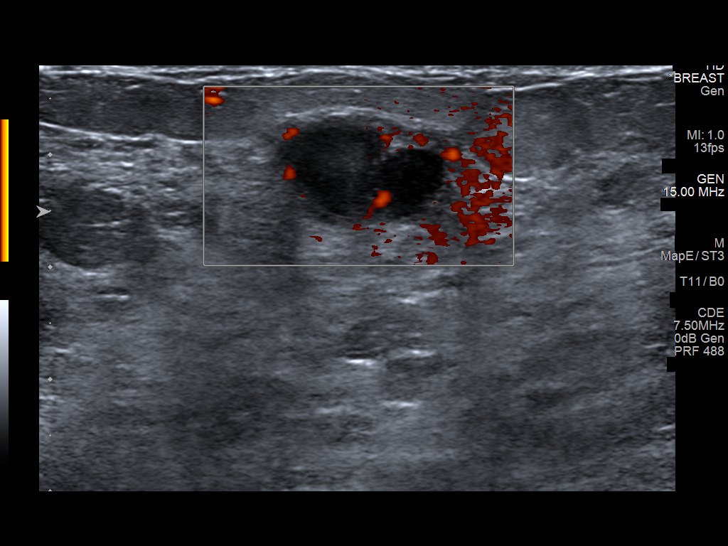
[im 5/17]
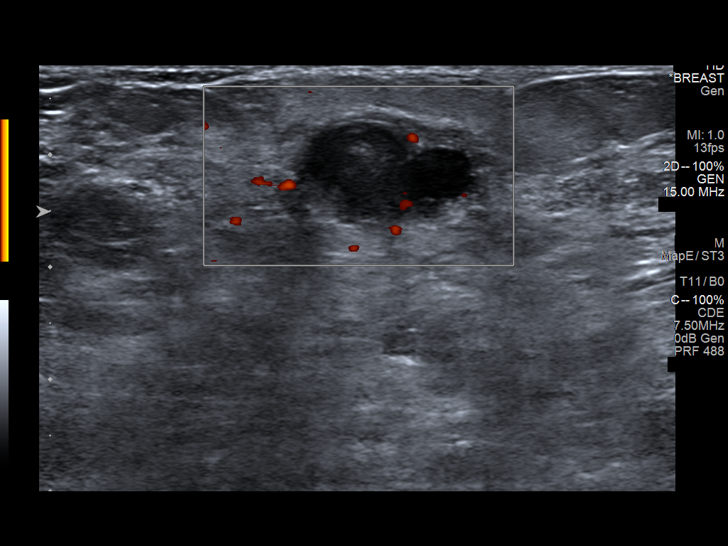
[im 6/17]
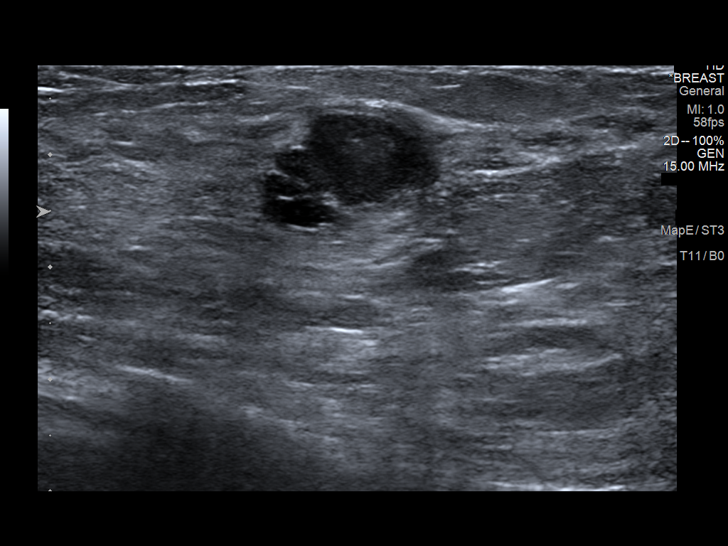
[im 8/17]
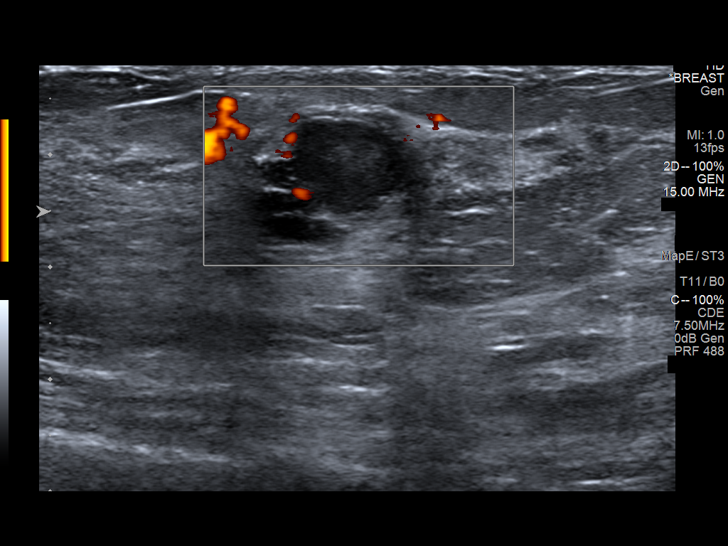
[im 9/17]
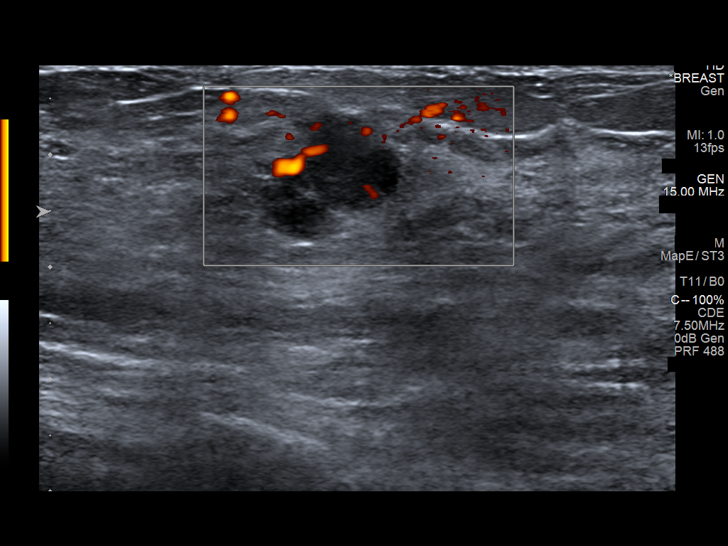
[im 10/17]
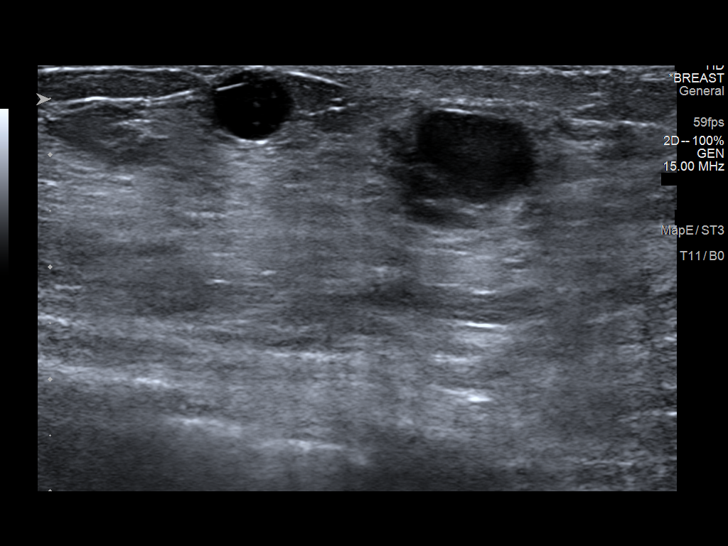
[im 12/17]
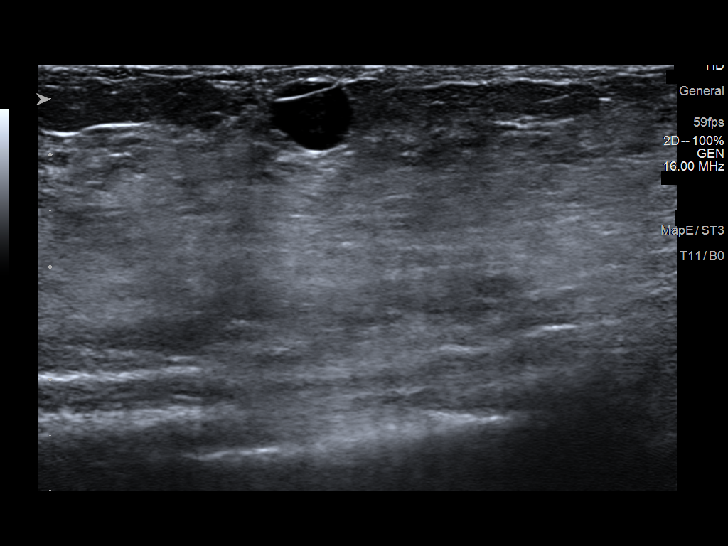
[im 13/17]
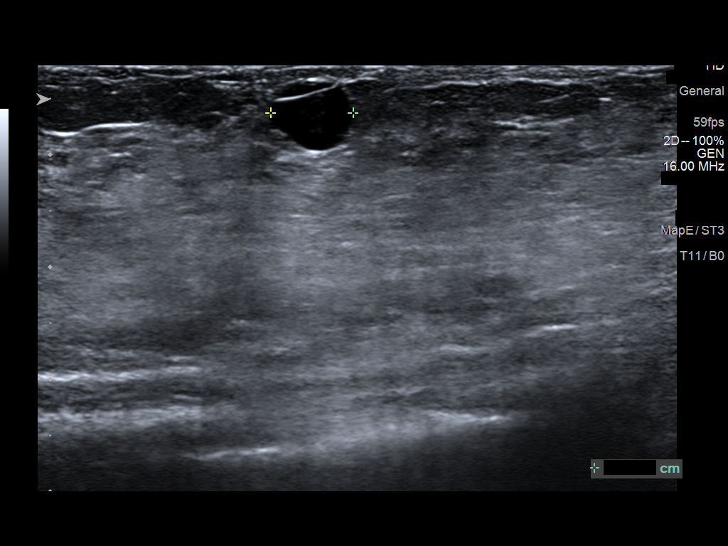
[im 14/17]
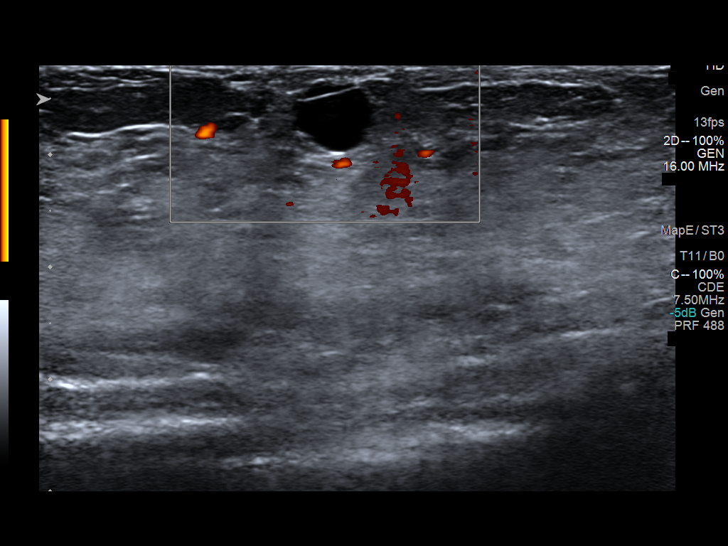
[im 16/17]
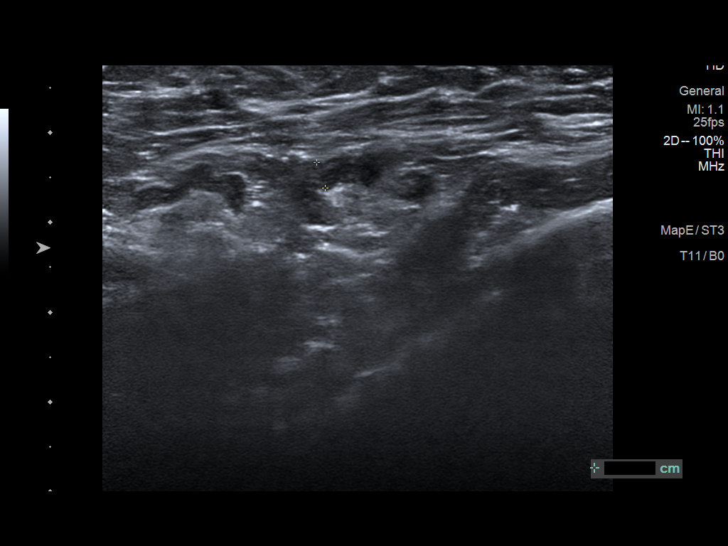
[im 17/17]
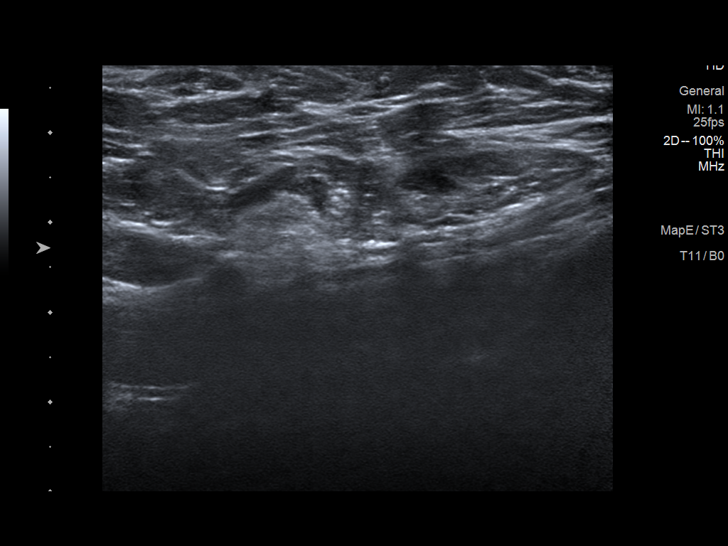

[13 of 17 positions shown; findings below may reference images not displayed]

FINDINGS: Ultrasound of the left breast at 10 o'clock, 7 cm from the nipple
demonstrates a complex mass measuring 1.6 x 0.9 x 1.4 cm. This could
be a solid and cystic mass, or could represent a complicated cyst
with a septation. There appears to be blood flow either within the
solid portion of the mass, or within a septation. A separate benign
cyst is seen in the left breast at 10 o'clock, 8 cm from the nipple
measuring 7 mm. Ultrasound of the left axilla demonstrates multiple
normal appearing lymph nodes.
IMPRESSION: 1. There is an indeterminate mass at the palpable site in the left
breast at 10 o'clock. This may represent a complex cyst or a
complicated mass.

2.  No evidence of left axillary lymphadenopathy.

RECOMMENDATION:
Ultrasound guided biopsy is recommended for the left breast mass.
This has been scheduled for 04/23/2019 at [DATE] a.m.

I have discussed the findings and recommendations with the patient.
If applicable, a reminder letter will be sent to the patient
regarding the next appointment.

BI-RADS CATEGORY  4: Suspicious.

## 2020-10-11 IMAGING — US US BREAST BX W LOC DEV 1ST LESION IMG BX SPEC US GUIDE*L*
1 series · 11 of 11 positions shown · non-contrast
Comparison: Previous exam(s).
COMPARISON: Previous exam(s).

Addendum:
CLINICAL DATA: Patient presents for ultrasound-guided core needle
biopsy of a left breast mass.

EXAM:
ULTRASOUND GUIDED LEFT BREAST CORE NEEDLE BIOPSY

[Series 1: us breast bx w loc dev 1st lesion img bx spec us g · 0.06mm/px · 11 of 11 slices shown]
[im 1/11]
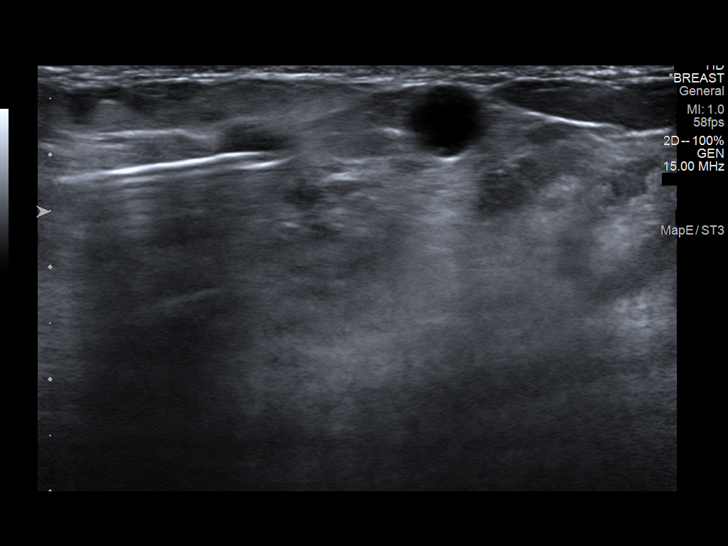
[im 2/11]
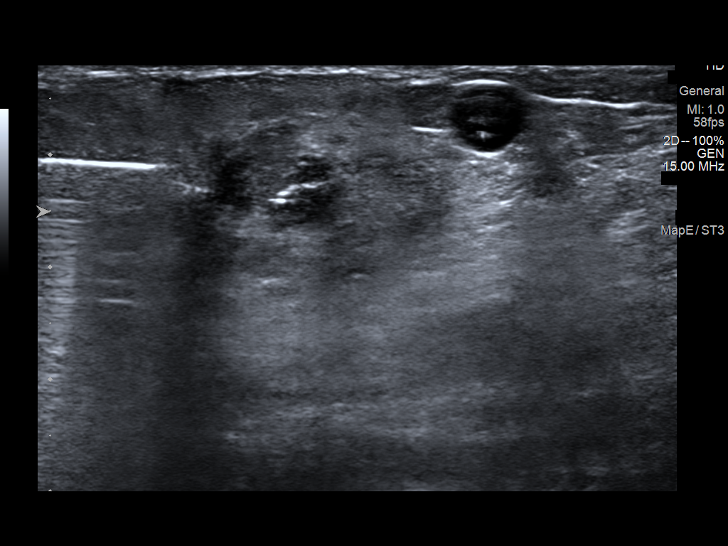
[im 3/11]
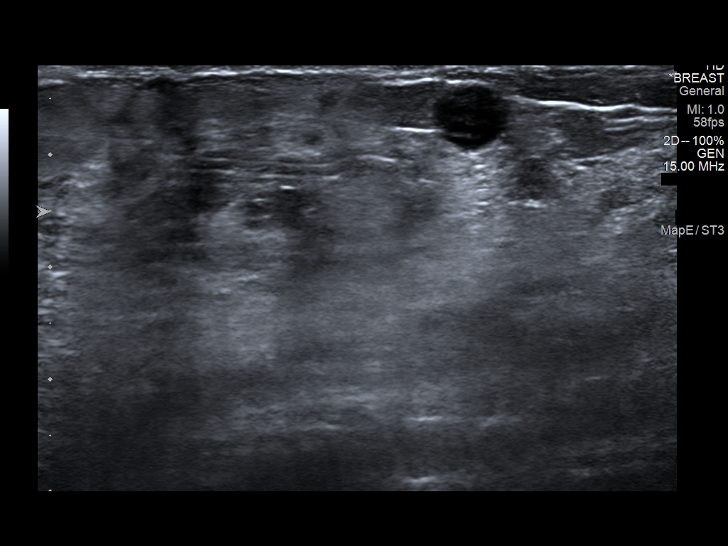
[im 4/11]
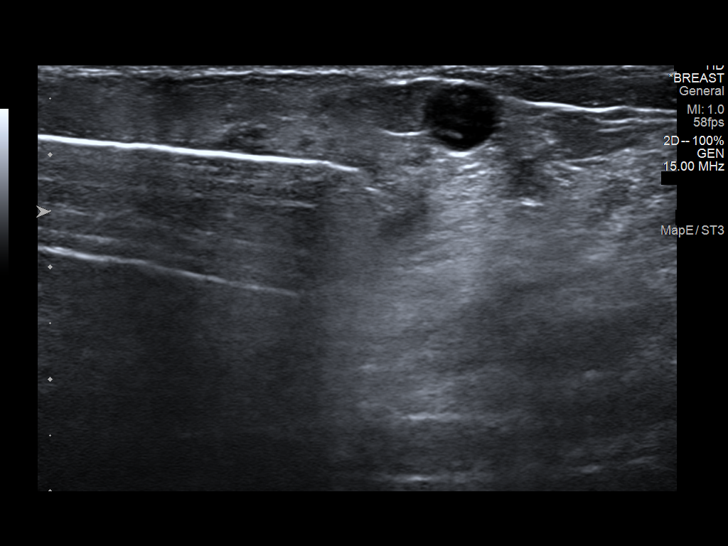
[im 5/11]
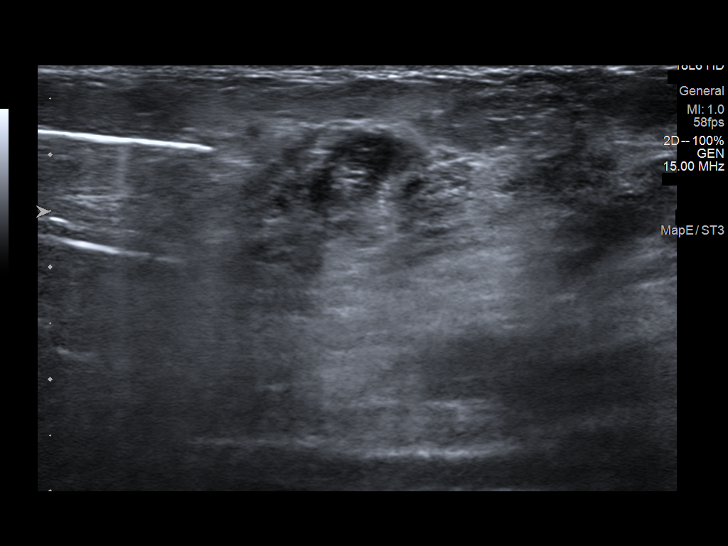
[im 6/11]
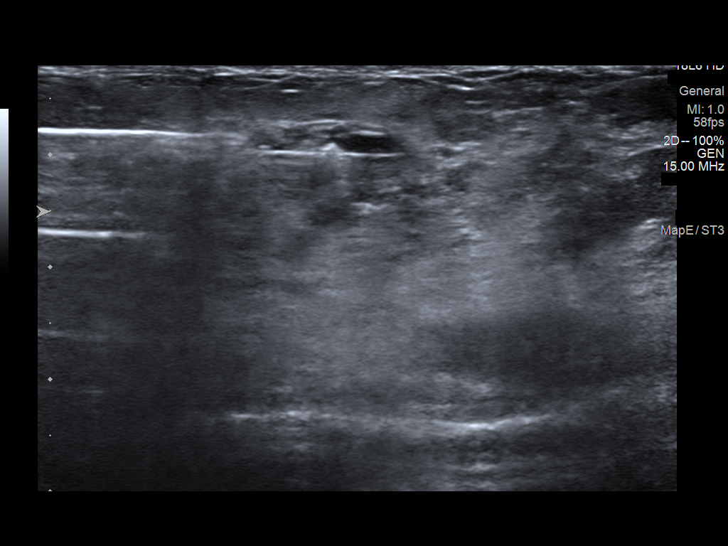
[im 7/11]
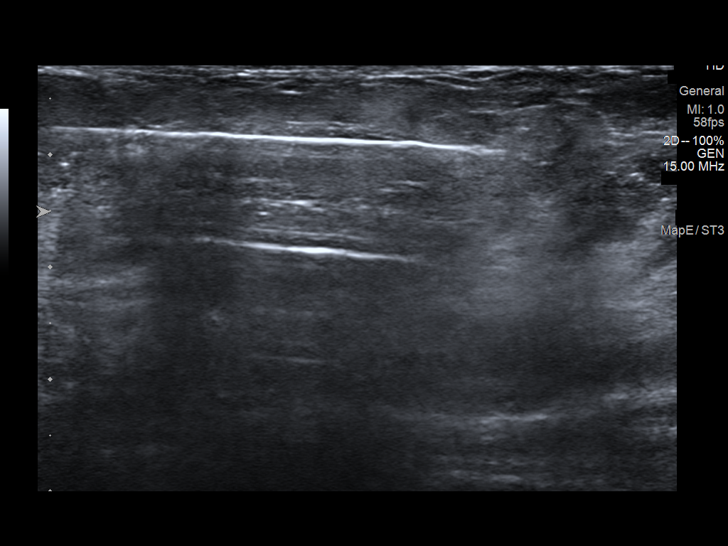
[im 8/11]
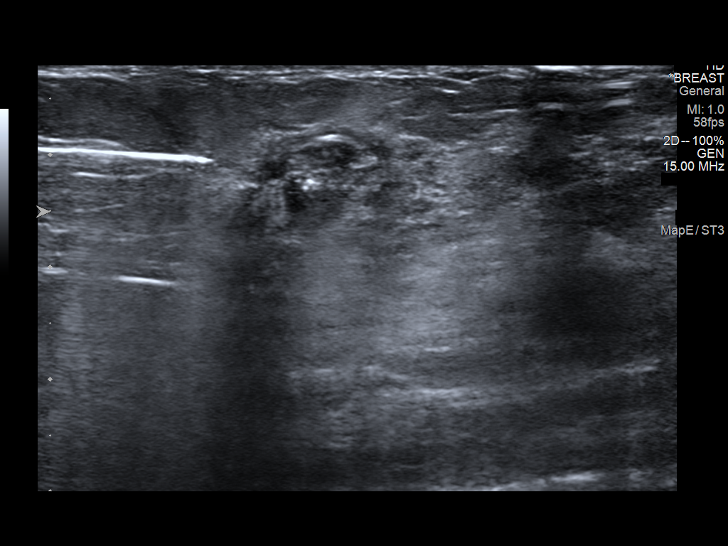
[im 9/11]
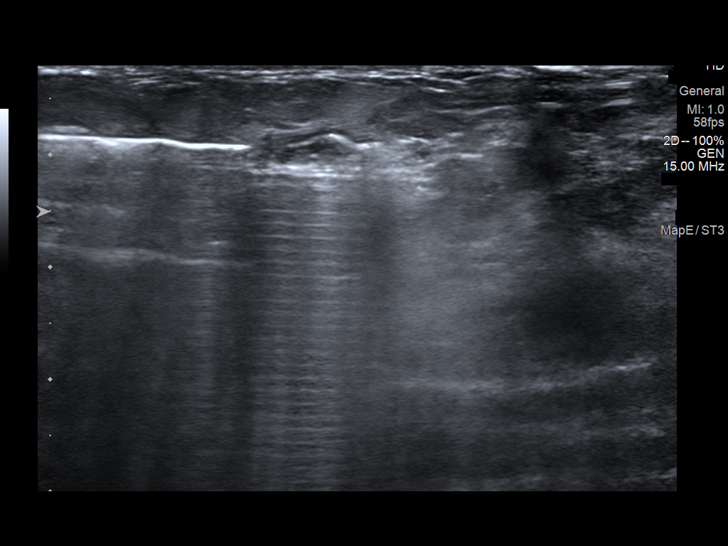
[im 10/11]
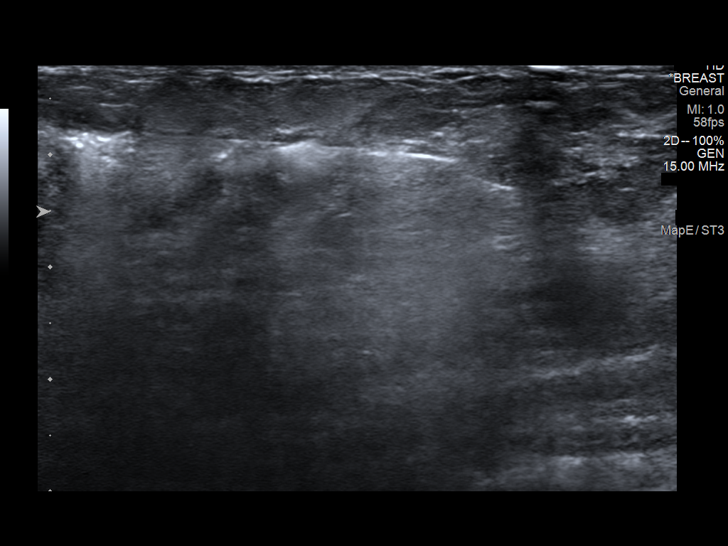
[im 11/11]
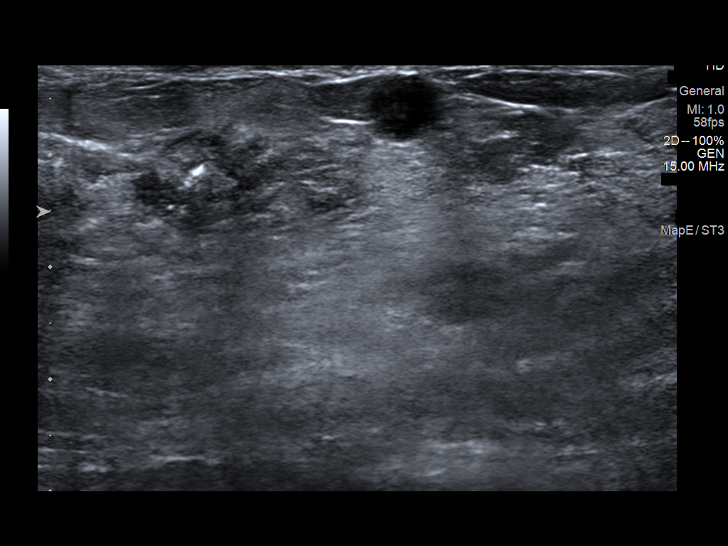

[11 of 11 positions shown; findings below may reference images not displayed]



Lesion quadrant: Upper inner quadrant

Aspiration of the lesion was initially attempted, within a fluid
acquired. Some portions of the mass did decompress following a local
anesthetic consistent with disruption cystic components.

Using sterile technique and 1% Lidocaine as local anesthetic, under
direct ultrasound visualization, a 12 gauge Gladovic device was
used to perform biopsy of the 10 o'clock position left breast
complex mass using a inferomedial approach. At the conclusion of the
procedure coil shaped tissue marker clip was deployed into the
biopsy cavity. Follow up 2 view mammogram was performed and dictated
separately.
IMPRESSION: Ultrasound guided biopsy of the left breast. No apparent
complications.

ADDENDUM:
Pathology revealed FIBROCYSTIC CHANGES WITH EVIDENCE OF RUPTURED
CYST. NO MALIGNANCY IDENTIFIED of the LEFT breast, 10 o'clock, 7 cm
from nipple. This was found to be concordant by Dr. Kenya Brogdon.

Pathology results were discussed with the patient by telephone. The
patient reported doing well after the biopsy with tenderness at the
site. Post biopsy instructions and care were reviewed and questions
were answered. The patient was encouraged to call The [REDACTED]

The patient was instructed to continue with monthly self breast
examinations, clinical follow-up as needed, and to return for annual
mammography at 39. The patient was informed a reminder notice would
be sent regarding this appointment.

Pathology results reported by Quirijn Amazigh, RN on 04/24/2019.



Lesion quadrant: Upper inner quadrant

Aspiration of the lesion was initially attempted, within a fluid
acquired. Some portions of the mass did decompress following a local
anesthetic consistent with disruption cystic components.

Using sterile technique and 1% Lidocaine as local anesthetic, under
direct ultrasound visualization, a 12 gauge Gladovic device was
used to perform biopsy of the 10 o'clock position left breast
complex mass using a inferomedial approach. At the conclusion of the
procedure coil shaped tissue marker clip was deployed into the
biopsy cavity. Follow up 2 view mammogram was performed and dictated
separately.
IMPRESSION: Ultrasound guided biopsy of the left breast. No apparent
complications.

## 2021-02-03 DIAGNOSIS — Z3201 Encounter for pregnancy test, result positive: Secondary | ICD-10-CM | POA: Diagnosis not present

## 2021-02-03 DIAGNOSIS — N911 Secondary amenorrhea: Secondary | ICD-10-CM | POA: Diagnosis not present

## 2021-02-24 DIAGNOSIS — Z3490 Encounter for supervision of normal pregnancy, unspecified, unspecified trimester: Secondary | ICD-10-CM | POA: Insufficient documentation

## 2021-02-24 DIAGNOSIS — O26891 Other specified pregnancy related conditions, first trimester: Secondary | ICD-10-CM | POA: Diagnosis not present

## 2021-02-24 DIAGNOSIS — Z3A09 9 weeks gestation of pregnancy: Secondary | ICD-10-CM | POA: Diagnosis not present

## 2021-02-24 DIAGNOSIS — Z369 Encounter for antenatal screening, unspecified: Secondary | ICD-10-CM | POA: Diagnosis not present

## 2021-02-24 DIAGNOSIS — Z113 Encounter for screening for infections with a predominantly sexual mode of transmission: Secondary | ICD-10-CM | POA: Diagnosis not present

## 2021-02-24 DIAGNOSIS — Z3481 Encounter for supervision of other normal pregnancy, first trimester: Secondary | ICD-10-CM | POA: Diagnosis not present

## 2021-02-24 LAB — OB RESULTS CONSOLE VARICELLA ZOSTER ANTIBODY, IGG: Varicella: IMMUNE

## 2021-02-24 LAB — OB RESULTS CONSOLE ANTIBODY SCREEN: Antibody Screen: NEGATIVE

## 2021-02-24 LAB — OB RESULTS CONSOLE HIV ANTIBODY (ROUTINE TESTING): HIV: NONREACTIVE

## 2021-02-24 LAB — OB RESULTS CONSOLE RUBELLA ANTIBODY, IGM: Rubella: IMMUNE

## 2021-02-24 LAB — OB RESULTS CONSOLE ABO/RH: RH Type: POSITIVE

## 2021-02-24 LAB — OB RESULTS CONSOLE RPR: RPR: NONREACTIVE

## 2021-02-24 LAB — OB RESULTS CONSOLE HEPATITIS B SURFACE ANTIGEN: Hepatitis B Surface Ag: NEGATIVE

## 2021-02-24 LAB — OB RESULTS CONSOLE GC/CHLAMYDIA
Chlamydia: NEGATIVE
Gonorrhea: NEGATIVE

## 2021-02-24 LAB — HEPATITIS C ANTIBODY: HCV Ab: NEGATIVE

## 2021-03-24 DIAGNOSIS — Z3A13 13 weeks gestation of pregnancy: Secondary | ICD-10-CM | POA: Diagnosis not present

## 2021-03-24 DIAGNOSIS — Z3481 Encounter for supervision of other normal pregnancy, first trimester: Secondary | ICD-10-CM | POA: Diagnosis not present

## 2021-04-29 DIAGNOSIS — Z23 Encounter for immunization: Secondary | ICD-10-CM | POA: Diagnosis not present

## 2021-04-29 DIAGNOSIS — Z363 Encounter for antenatal screening for malformations: Secondary | ICD-10-CM | POA: Diagnosis not present

## 2021-04-29 DIAGNOSIS — Z3482 Encounter for supervision of other normal pregnancy, second trimester: Secondary | ICD-10-CM | POA: Diagnosis not present

## 2021-04-29 DIAGNOSIS — Z3A18 18 weeks gestation of pregnancy: Secondary | ICD-10-CM | POA: Diagnosis not present

## 2021-06-09 DIAGNOSIS — R233 Spontaneous ecchymoses: Secondary | ICD-10-CM | POA: Diagnosis not present

## 2021-07-02 DIAGNOSIS — Z23 Encounter for immunization: Secondary | ICD-10-CM | POA: Diagnosis not present

## 2021-07-02 DIAGNOSIS — Z3A27 27 weeks gestation of pregnancy: Secondary | ICD-10-CM | POA: Diagnosis not present

## 2021-07-02 DIAGNOSIS — Z3689 Encounter for other specified antenatal screening: Secondary | ICD-10-CM | POA: Diagnosis not present

## 2021-07-02 DIAGNOSIS — Z3482 Encounter for supervision of other normal pregnancy, second trimester: Secondary | ICD-10-CM | POA: Diagnosis not present

## 2021-07-05 NOTE — L&D Delivery Note (Signed)
Delivery Note ?She progressed to complete and pushed well.  At 9:49 PM a viable female was delivered via Vaginal, Spontaneous (Presentation: Left Occiput Anterior).  APGAR: 8, 9; weight pending.   ?Placenta status: Spontaneous, Intact.  Cord: 3 vessels with the following complications: Nuchal x 1 reduced. ? ?Anesthesia: Epidural ?Episiotomy: None ?Lacerations: 2nd degree ?Suture Repair: 3.0 vicryl rapide ?Est. Blood Loss (mL): 150 ? ?Mom to postpartum.  Baby to Couplet care / Skin to Skin.  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 ?Blane Ohara Alaynah Schutter ?09/23/2021, 10:20 PM ? ? ? ?

## 2021-07-15 DIAGNOSIS — R002 Palpitations: Secondary | ICD-10-CM | POA: Diagnosis not present

## 2021-07-20 DIAGNOSIS — E0590 Thyrotoxicosis, unspecified without thyrotoxic crisis or storm: Secondary | ICD-10-CM | POA: Diagnosis not present

## 2021-07-20 DIAGNOSIS — I10 Essential (primary) hypertension: Secondary | ICD-10-CM | POA: Diagnosis not present

## 2021-07-20 DIAGNOSIS — Z3483 Encounter for supervision of other normal pregnancy, third trimester: Secondary | ICD-10-CM | POA: Diagnosis not present

## 2021-07-20 DIAGNOSIS — R002 Palpitations: Secondary | ICD-10-CM | POA: Diagnosis not present

## 2021-07-24 DIAGNOSIS — Z3490 Encounter for supervision of normal pregnancy, unspecified, unspecified trimester: Secondary | ICD-10-CM | POA: Diagnosis not present

## 2021-07-24 DIAGNOSIS — I10 Essential (primary) hypertension: Secondary | ICD-10-CM | POA: Diagnosis not present

## 2021-07-24 DIAGNOSIS — R002 Palpitations: Secondary | ICD-10-CM | POA: Diagnosis not present

## 2021-07-28 DIAGNOSIS — I10 Essential (primary) hypertension: Secondary | ICD-10-CM | POA: Diagnosis not present

## 2021-07-28 DIAGNOSIS — Z3490 Encounter for supervision of normal pregnancy, unspecified, unspecified trimester: Secondary | ICD-10-CM | POA: Diagnosis not present

## 2021-07-28 DIAGNOSIS — R002 Palpitations: Secondary | ICD-10-CM | POA: Diagnosis not present

## 2021-08-05 DIAGNOSIS — Z331 Pregnant state, incidental: Secondary | ICD-10-CM | POA: Diagnosis not present

## 2021-08-05 DIAGNOSIS — E0590 Thyrotoxicosis, unspecified without thyrotoxic crisis or storm: Secondary | ICD-10-CM | POA: Diagnosis not present

## 2021-08-05 DIAGNOSIS — R000 Tachycardia, unspecified: Secondary | ICD-10-CM | POA: Diagnosis not present

## 2021-08-11 DIAGNOSIS — I10 Essential (primary) hypertension: Secondary | ICD-10-CM | POA: Diagnosis not present

## 2021-08-11 DIAGNOSIS — Z3490 Encounter for supervision of normal pregnancy, unspecified, unspecified trimester: Secondary | ICD-10-CM | POA: Diagnosis not present

## 2021-08-11 DIAGNOSIS — R002 Palpitations: Secondary | ICD-10-CM | POA: Diagnosis not present

## 2021-08-12 DIAGNOSIS — Z3483 Encounter for supervision of other normal pregnancy, third trimester: Secondary | ICD-10-CM | POA: Diagnosis not present

## 2021-08-12 DIAGNOSIS — Z3482 Encounter for supervision of other normal pregnancy, second trimester: Secondary | ICD-10-CM | POA: Diagnosis not present

## 2021-08-24 ENCOUNTER — Observation Stay (HOSPITAL_COMMUNITY)
Admission: AD | Admit: 2021-08-24 | Discharge: 2021-08-25 | Disposition: A | Discharge status: Home / Self Care | Payer: PRIVATE HEALTH INSURANCE | Attending: Obstetrics and Gynecology | Admitting: Obstetrics and Gynecology

## 2021-08-24 ENCOUNTER — Encounter (HOSPITAL_COMMUNITY): Payer: Self-pay

## 2021-08-24 ENCOUNTER — Inpatient Hospital Stay (HOSPITAL_BASED_OUTPATIENT_CLINIC_OR_DEPARTMENT_OTHER): Payer: PRIVATE HEALTH INSURANCE

## 2021-08-24 ENCOUNTER — Other Ambulatory Visit: Payer: Self-pay

## 2021-08-24 DIAGNOSIS — O9A219 Injury, poisoning and certain other consequences of external causes complicating pregnancy, unspecified trimester: Secondary | ICD-10-CM | POA: Diagnosis not present

## 2021-08-24 DIAGNOSIS — O9A213 Injury, poisoning and certain other consequences of external causes complicating pregnancy, third trimester: Secondary | ICD-10-CM | POA: Diagnosis not present

## 2021-08-24 DIAGNOSIS — W501XXA Accidental kick by another person, initial encounter: Secondary | ICD-10-CM | POA: Insufficient documentation

## 2021-08-24 DIAGNOSIS — S3991XA Unspecified injury of abdomen, initial encounter: Secondary | ICD-10-CM | POA: Insufficient documentation

## 2021-08-24 DIAGNOSIS — O0993 Supervision of high risk pregnancy, unspecified, third trimester: Secondary | ICD-10-CM | POA: Insufficient documentation

## 2021-08-24 DIAGNOSIS — Z3A35 35 weeks gestation of pregnancy: Secondary | ICD-10-CM | POA: Diagnosis not present

## 2021-08-24 DIAGNOSIS — Y92531 Health care provider office as the place of occurrence of the external cause: Secondary | ICD-10-CM | POA: Diagnosis not present

## 2021-08-24 DIAGNOSIS — Y990 Civilian activity done for income or pay: Secondary | ICD-10-CM | POA: Diagnosis not present

## 2021-08-24 DIAGNOSIS — Y9389 Activity, other specified: Secondary | ICD-10-CM | POA: Insufficient documentation

## 2021-08-24 DIAGNOSIS — Z20822 Contact with and (suspected) exposure to covid-19: Secondary | ICD-10-CM | POA: Insufficient documentation

## 2021-08-24 HISTORY — DX: Injury, poisoning and certain other consequences of external causes complicating pregnancy, third trimester: O9A.213

## 2021-08-24 LAB — RESP PANEL BY RT-PCR (FLU A&B, COVID) ARPGX2
Influenza A by PCR: NEGATIVE
Influenza B by PCR: NEGATIVE
SARS Coronavirus 2 by RT PCR: NEGATIVE

## 2021-08-24 MED ORDER — DOCUSATE SODIUM 100 MG PO CAPS
100.0000 mg | ORAL_CAPSULE | Freq: Every day | ORAL | Status: DC
  Administered 2021-08-25: 100 mg via ORAL
  Filled 2021-08-24: qty 1

## 2021-08-24 MED ORDER — LACTATED RINGERS IV BOLUS
1000.0000 mL | Freq: Once | INTRAVENOUS | Status: AC
  Administered 2021-08-24: 1000 mL via INTRAVENOUS

## 2021-08-24 MED ORDER — CALCIUM CARBONATE ANTACID 500 MG PO CHEW: 2.0000 | CHEWABLE_TABLET | ORAL | Status: DC | PRN

## 2021-08-24 MED ORDER — PRENATAL MULTIVITAMIN CH
1.0000 | ORAL_TABLET | Freq: Every day | ORAL | Status: DC
  Administered 2021-08-25: 1 via ORAL
  Filled 2021-08-24: qty 1

## 2021-08-24 MED ORDER — SODIUM CHLORIDE 0.9% FLUSH: 3.0000 mL | INTRAVENOUS | Status: DC | PRN

## 2021-08-24 MED ORDER — ZOLPIDEM TARTRATE 5 MG PO TABS: 5.0000 mg | ORAL_TABLET | Freq: Every evening | ORAL | Status: DC | PRN

## 2021-08-24 MED ORDER — ACETAMINOPHEN 500 MG PO TABS: 1000.0000 mg | ORAL_TABLET | Freq: Once | ORAL | Status: DC

## 2021-08-24 MED ORDER — SODIUM CHLORIDE 0.9 % IV SOLN: 250.0000 mL | INTRAVENOUS | Status: DC | PRN

## 2021-08-24 MED ORDER — ACETAMINOPHEN 325 MG PO TABS: 650.0000 mg | ORAL_TABLET | ORAL | Status: DC | PRN

## 2021-08-24 MED ORDER — SODIUM CHLORIDE 0.9% FLUSH
3.0000 mL | Freq: Two times a day (BID) | INTRAVENOUS | Status: DC
  Administered 2021-08-25: 3 mL via INTRAVENOUS
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
## 2021-08-25 DIAGNOSIS — O0993 Supervision of high risk pregnancy, unspecified, third trimester: Secondary | ICD-10-CM | POA: Diagnosis not present

## 2021-08-25 DIAGNOSIS — O9A213 Injury, poisoning and certain other consequences of external causes complicating pregnancy, third trimester: Secondary | ICD-10-CM | POA: Diagnosis not present

## 2021-08-25 DIAGNOSIS — Z20822 Contact with and (suspected) exposure to covid-19: Secondary | ICD-10-CM | POA: Diagnosis not present

## 2021-08-25 DIAGNOSIS — Z3A35 35 weeks gestation of pregnancy: Secondary | ICD-10-CM | POA: Diagnosis not present

## 2021-08-25 DIAGNOSIS — S3991XA Unspecified injury of abdomen, initial encounter: Secondary | ICD-10-CM | POA: Diagnosis not present
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
## 2021-08-31 DIAGNOSIS — Z3A36 36 weeks gestation of pregnancy: Secondary | ICD-10-CM | POA: Diagnosis not present

## 2021-08-31 DIAGNOSIS — Z3685 Encounter for antenatal screening for Streptococcus B: Secondary | ICD-10-CM | POA: Diagnosis not present

## 2021-08-31 DIAGNOSIS — O99283 Endocrine, nutritional and metabolic diseases complicating pregnancy, third trimester: Secondary | ICD-10-CM | POA: Diagnosis not present

## 2021-08-31 LAB — OB RESULTS CONSOLE GBS: GBS: NEGATIVE

## 2021-09-02 DIAGNOSIS — E0590 Thyrotoxicosis, unspecified without thyrotoxic crisis or storm: Secondary | ICD-10-CM | POA: Diagnosis not present

## 2021-09-14 ENCOUNTER — Telehealth (HOSPITAL_COMMUNITY): Payer: Self-pay | Admitting: *Deleted

## 2021-09-14 NOTE — Telephone Encounter (Signed)
Preadmission screen  

## 2021-09-15 ENCOUNTER — Encounter (HOSPITAL_COMMUNITY): Payer: Self-pay | Admitting: Obstetrics and Gynecology

## 2021-09-15 ENCOUNTER — Other Ambulatory Visit: Payer: Self-pay

## 2021-09-15 ENCOUNTER — Inpatient Hospital Stay (HOSPITAL_COMMUNITY)
Admission: AD | Admit: 2021-09-15 | Discharge: 2021-09-15 | Disposition: A | Discharge status: Home / Self Care | Payer: 59 | Attending: Obstetrics and Gynecology | Admitting: Obstetrics and Gynecology

## 2021-09-15 DIAGNOSIS — O479 False labor, unspecified: Secondary | ICD-10-CM

## 2021-09-15 DIAGNOSIS — Z3A38 38 weeks gestation of pregnancy: Secondary | ICD-10-CM | POA: Diagnosis not present

## 2021-09-15 DIAGNOSIS — O471 False labor at or after 37 completed weeks of gestation: Secondary | ICD-10-CM | POA: Insufficient documentation

## 2021-09-15 DIAGNOSIS — Z3689 Encounter for other specified antenatal screening: Secondary | ICD-10-CM | POA: Diagnosis not present

## 2021-09-15 DIAGNOSIS — O26893 Other specified pregnancy related conditions, third trimester: Secondary | ICD-10-CM | POA: Insufficient documentation

## 2021-09-15 LAB — URINALYSIS, ROUTINE W REFLEX MICROSCOPIC
Bilirubin Urine: NEGATIVE
Glucose, UA: NEGATIVE mg/dL
Hgb urine dipstick: NEGATIVE
Ketones, ur: NEGATIVE mg/dL
Nitrite: NEGATIVE
Protein, ur: NEGATIVE mg/dL
Specific Gravity, Urine: 1.003 — ABNORMAL LOW (ref 1.005–1.030)
pH: 7 (ref 5.0–8.0)

## 2021-09-15 MED ORDER — LACTATED RINGERS IV BOLUS
1000.0000 mL | Freq: Once | INTRAVENOUS | Status: AC
Start: 2021-09-15 — End: 2021-09-15
  Administered 2021-09-15: 1000 mL via INTRAVENOUS

## 2021-09-15 NOTE — MAU Note (Signed)
...  Melissa Ashley is a 26 y.o. at 64w3dhere in MAU reporting: CTX since 1100 this morning. She states they are currently 3-5 minutes apart. Also endorsing lower back pain that is constant that began around an hour ago. Denies LOF but states she thinks she may have lost her mucous plug. Denies VB. +FM.  ? ?Last SVE in office at 36 weeks. Patient states she was 0.5 cm. ? ?Pain score:  ?5/10 lower abdomen ? ?FHT: 190's-200's external monitor in triage ? ? ? ?

## 2021-09-15 NOTE — MAU Provider Note (Signed)
?History  ?  ? ?CSN: [id number] ? ?Arrival date and time: 09/15/21 1447 ? ? Event Date/Time  ? First Provider Initiated Contact with Patient 09/15/21 1536   ?  ?26 y.o. G2P1001 '@38'$ .3 wks presenting with ctx and LBP. Reports onset of ctx this am around 11am. Frequency is q3-5. This afternoon she started having LBP. Denies LOF or VB. Reports good FM.  ? ? ?OB History   ? ? Gravida  ?2  ? Para  ?1  ? Term  ?1  ? Preterm  ?   ? AB  ?   ? Living  ?1  ?  ? ? SAB  ?   ? IAB  ?   ? Ectopic  ?   ? Multiple  ?0  ? Live Births  ?1  ?   ?  ?  ? ? ?Past Medical History:  ?Diagnosis Date  ? Mitral valve prolapse   ? asymptomatic  ? Scoliosis   ? pt has xray pic from few years ago  ? ? ?Past Surgical History:  ?Procedure Laterality Date  ? BREAST BIOPSY Left 04/23/2019  ? benign- ruptured cyst and density  ? TONSILLECTOMY  2004  ? hemorrhaged  ? ? ?Family History  ?Problem Relation Age of Onset  ? Hypertension Father   ? COPD Maternal Grandmother   ? Drug abuse Maternal Grandmother   ? Heart disease Maternal Grandmother   ? Uterine cancer Maternal Grandmother   ? Miscarriages / [place] Mother   ? Learning disabilities Sister   ? Intellectual disability Sister   ? Learning disabilities Brother   ? Intellectual disability Brother   ? Diabetes Maternal Grandfather   ? Breast cancer Paternal Grandmother 89  ? Heart disease Paternal Grandfather   ? Kidney disease Paternal Grandfather   ? Prostate cancer Paternal Grandfather   ? Hyperlipidemia Sister   ? Hypertension Sister   ? ? ?Social History  ? ?Tobacco Use  ? Smoking status: Never  ? Smokeless tobacco: Never  ?Vaping Use  ? Vaping Use: Never used  ?Substance Use Topics  ? Alcohol use: Never  ? Drug use: Never  ? ? ?Allergies:  ?Allergies  ?Allergen Reactions  ? Latex   ? Neomycin Swelling  ? Shellfish Allergy Itching and Swelling  ? Sulfa Antibiotics Swelling  ? ? ?No medications prior to admission.  ? ? ?Review of Systems  ?Constitutional:  Negative for fever.   ?Gastrointestinal:  Negative for abdominal pain.  ?Genitourinary:  Negative for vaginal bleeding and vaginal discharge.  ?Musculoskeletal:  Positive for back pain.  ?Physical Exam  ? ?Blood pressure 108/67, pulse 87, temperature 98.4 ?F (36.9 ?C), temperature source Oral, resp. rate 17, height '5\' 4"'$  (1.626 m), weight 88.7 kg, SpO2 100 %. ? ?Physical Exam ?Vitals and nursing note reviewed.  ?Constitutional:   ?   Appearance: Normal appearance.  ?HENT:  ?   Head: Normocephalic and atraumatic.  ?Cardiovascular:  ?   Rate and Rhythm: Normal rate.  ?Pulmonary:  ?   Effort: Pulmonary effort is normal.  ?Abdominal:  ?   Palpations: Abdomen is soft.  ?   Tenderness: There is no abdominal tenderness.  ?Genitourinary: ?   Comments: FT/Th per RN ?Musculoskeletal:     ?   General: Normal range of motion.  ?   Cervical back: Normal range of motion.  ?Skin: ?   General: Skin is warm and dry.  ?Neurological:  ?   General: No focal deficit present.  ?  Mental Status: She is alert and oriented to person, place, and time.  ?Psychiatric:     ?   Mood and Affect: Mood normal.     ?   Behavior: Behavior normal.  ?EFM: 180 bpm, mod variability, + accels, no decels ?Toco: irreg ? ?Results for orders placed or performed during the hospital encounter of 09/15/21 (from the past 24 hour(s))  ?Urinalysis, Routine w reflex microscopic Urine, Clean Catch     Status: Abnormal  ? Collection Time: 09/15/21  3:48 PM  ?Result Value Ref Range  ? Color, Urine YELLOW YELLOW  ? APPearance HAZY (A) CLEAR  ? Specific Gravity, Urine 1.003 (L) 1.005 - 1.030  ? pH 7.0 5.0 - 8.0  ? Glucose, UA NEGATIVE NEGATIVE mg/dL  ? Hgb urine dipstick NEGATIVE NEGATIVE  ? Bilirubin Urine NEGATIVE NEGATIVE  ? Ketones, ur NEGATIVE NEGATIVE mg/dL  ? Protein, ur NEGATIVE NEGATIVE mg/dL  ? Nitrite NEGATIVE NEGATIVE  ? Leukocytes,Ua SMALL (A) NEGATIVE  ? RBC / HPF 0-5 0 - 5 RBC/hpf  ? WBC, UA 0-5 0 - 5 WBC/hpf  ? Bacteria, UA RARE (A) NONE SEEN  ? Squamous Epithelial / LPF  6-10 0 - 5  ? ? ?MAU Course  ?Procedures ? ?MDM ?Prenatal records reviewed: pregnancy complicated by obesity, hx MVP, palpitations evaluated by Cards with normal holter and ECHO, and hyperthyroidism not on meds.  ?Fetal tachycardia vs prolonged accel upon intake, IVF bolus started.  ?1645: FHR back down to normal baseline of 135 after IVF. Cervix unchanged. Stable for discharge home. ? ?Assessment and Plan  ? ?1. [redacted] weeks gestation of pregnancy   ?2. NST (non-stress test) reactive   ?3. False labor   ? ?Discharge home ?[facility] as scheduled ?Labor precautions ?FMCs ? ?Allergies as of 09/15/2021   ? ?   Reactions  ? Latex   ? Neomycin Swelling  ? Shellfish Allergy Itching, Swelling  ? Sulfa Antibiotics Swelling  ? ?  ? ?  ?Medication List  ?  ? ?TAKE these medications   ? ?prenatal multivitamin Tabs tablet ?Take 1 tablet by mouth daily at 12 noon. ?  ? ?  ? ?Julianne Handler, CNM ?09/15/2021, 5:45 PM  ?

## 2021-09-16 ENCOUNTER — Telehealth (HOSPITAL_COMMUNITY): Payer: Self-pay | Admitting: *Deleted

## 2021-09-16 ENCOUNTER — Encounter (HOSPITAL_COMMUNITY): Payer: Self-pay | Admitting: *Deleted

## 2021-09-16 NOTE — Telephone Encounter (Signed)
Preadmission screen  

## 2021-09-21 ENCOUNTER — Encounter (HOSPITAL_COMMUNITY): Payer: Self-pay | Admitting: Obstetrics and Gynecology

## 2021-09-21 ENCOUNTER — Telehealth (HOSPITAL_COMMUNITY): Payer: Self-pay | Admitting: *Deleted

## 2021-09-21 NOTE — Telephone Encounter (Signed)
Preadmission screen  

## 2021-09-22 ENCOUNTER — Other Ambulatory Visit: Payer: Self-pay | Admitting: Obstetrics and Gynecology

## 2021-09-23 ENCOUNTER — Inpatient Hospital Stay (HOSPITAL_COMMUNITY): Payer: 59

## 2021-09-23 ENCOUNTER — Inpatient Hospital Stay (HOSPITAL_COMMUNITY): Payer: 59 | Admitting: Anesthesiology

## 2021-09-23 ENCOUNTER — Other Ambulatory Visit: Payer: Self-pay

## 2021-09-23 ENCOUNTER — Inpatient Hospital Stay (HOSPITAL_COMMUNITY)
Admission: AD | Admit: 2021-09-23 | Discharge: 2021-09-25 | DRG: 807 | Disposition: A | Discharge status: Home / Self Care | Payer: 59 | Attending: Obstetrics and Gynecology | Admitting: Obstetrics and Gynecology

## 2021-09-23 ENCOUNTER — Encounter (HOSPITAL_COMMUNITY): Payer: Self-pay | Admitting: Obstetrics and Gynecology

## 2021-09-23 DIAGNOSIS — Z3A39 39 weeks gestation of pregnancy: Secondary | ICD-10-CM

## 2021-09-23 DIAGNOSIS — O99284 Endocrine, nutritional and metabolic diseases complicating childbirth: Secondary | ICD-10-CM | POA: Diagnosis not present

## 2021-09-23 DIAGNOSIS — O26893 Other specified pregnancy related conditions, third trimester: Secondary | ICD-10-CM | POA: Diagnosis present

## 2021-09-23 DIAGNOSIS — E0590 Thyrotoxicosis, unspecified without thyrotoxic crisis or storm: Secondary | ICD-10-CM | POA: Diagnosis not present

## 2021-09-23 HISTORY — DX: Gastro-esophageal reflux disease without esophagitis: K21.9

## 2021-09-23 HISTORY — DX: Thyrotoxicosis, unspecified without thyrotoxic crisis or storm: E05.90

## 2021-09-23 LAB — CBC
HCT: 37.1 % (ref 36.0–46.0)
Hemoglobin: 12.4 g/dL (ref 12.0–15.0)
MCH: 29.2 pg (ref 26.0–34.0)
MCHC: 33.4 g/dL (ref 30.0–36.0)
MCV: 87.5 fL (ref 80.0–100.0)
Platelets: 187 10*3/uL (ref 150–400)
RBC: 4.24 MIL/uL (ref 3.87–5.11)
RDW: 14.5 % (ref 11.5–15.5)
WBC: 14.7 10*3/uL — ABNORMAL HIGH (ref 4.0–10.5)
nRBC: 0 % (ref 0.0–0.2)

## 2021-09-23 LAB — TYPE AND SCREEN
ABO/RH(D): A POS
Antibody Screen: NEGATIVE

## 2021-09-23 LAB — RPR: RPR Ser Ql: NONREACTIVE

## 2021-09-23 MED ORDER — DIPHENHYDRAMINE HCL 50 MG/ML IJ SOLN: 12.5000 mg | INTRAMUSCULAR | Status: DC | PRN

## 2021-09-23 MED ORDER — TRANEXAMIC ACID-NACL 1000-0.7 MG/100ML-% IV SOLN
1000.0000 mg | Freq: Once | INTRAVENOUS | Status: AC | PRN
  Administered 2021-09-23: 1000 mg via INTRAVENOUS
  Filled 2021-09-23: qty 100

## 2021-09-23 MED ORDER — LIDOCAINE HCL (PF) 1 % IJ SOLN
INTRAMUSCULAR | Status: DC | PRN
Start: 2021-09-23 — End: 2021-09-23
  Administered 2021-09-23: 2 mL via EPIDURAL
  Administered 2021-09-23: 3 mL via EPIDURAL
  Administered 2021-09-23: 5 mL via EPIDURAL

## 2021-09-23 MED ORDER — OXYTOCIN BOLUS FROM INFUSION
333.0000 mL | Freq: Once | INTRAVENOUS | Status: AC
Start: 2021-09-23 — End: 2021-09-23
  Administered 2021-09-23: 333 mL via INTRAVENOUS

## 2021-09-23 MED ORDER — FENTANYL-BUPIVACAINE-NACL 0.5-0.125-0.9 MG/250ML-% EP SOLN
12.0000 mL/h | EPIDURAL | Status: DC | PRN
  Administered 2021-09-23: 12 mL/h via EPIDURAL
  Filled 2021-09-23: qty 250

## 2021-09-23 MED ORDER — EPHEDRINE 5 MG/ML INJ: 10.0000 mg | INTRAVENOUS | Status: DC | PRN

## 2021-09-23 MED ORDER — TRANEXAMIC ACID-NACL 1000-0.7 MG/100ML-% IV SOLN: 1000.0000 mg | INTRAVENOUS | Status: DC

## 2021-09-23 MED ORDER — OXYTOCIN-SODIUM CHLORIDE 30-0.9 UT/500ML-% IV SOLN
2.5000 [IU]/h | INTRAVENOUS | Status: DC
  Filled 2021-09-23: qty 500

## 2021-09-23 MED ORDER — ONDANSETRON HCL 4 MG/2ML IJ SOLN
4.0000 mg | Freq: Four times a day (QID) | INTRAMUSCULAR | Status: DC | PRN
  Administered 2021-09-23: 4 mg via INTRAVENOUS
  Filled 2021-09-23: qty 2

## 2021-09-23 MED ORDER — LACTATED RINGERS IV SOLN: 500.0000 mL | Freq: Once | INTRAVENOUS | Status: DC

## 2021-09-23 MED ORDER — BUTORPHANOL TARTRATE 1 MG/ML IJ SOLN: 1.0000 mg | INTRAMUSCULAR | Status: DC | PRN

## 2021-09-23 MED ORDER — PHENYLEPHRINE 40 MCG/ML (10ML) SYRINGE FOR IV PUSH (FOR BLOOD PRESSURE SUPPORT): 80.0000 ug | PREFILLED_SYRINGE | INTRAVENOUS | Status: DC | PRN

## 2021-09-23 MED ORDER — LACTATED RINGERS IV SOLN: INTRAVENOUS | Status: DC

## 2021-09-23 MED ORDER — ACETAMINOPHEN 325 MG PO TABS: 650.0000 mg | ORAL_TABLET | ORAL | Status: DC | PRN

## 2021-09-23 MED ORDER — OXYCODONE-ACETAMINOPHEN 5-325 MG PO TABS: 2.0000 | ORAL_TABLET | ORAL | Status: DC | PRN

## 2021-09-23 MED ORDER — LIDOCAINE HCL (PF) 1 % IJ SOLN: 30.0000 mL | INTRAMUSCULAR | Status: DC | PRN

## 2021-09-23 MED ORDER — TERBUTALINE SULFATE 1 MG/ML IJ SOLN: 0.2500 mg | Freq: Once | INTRAMUSCULAR | Status: DC | PRN

## 2021-09-23 MED ORDER — SOD CITRATE-CITRIC ACID 500-334 MG/5ML PO SOLN: 30.0000 mL | ORAL | Status: DC | PRN

## 2021-09-23 MED ORDER — LACTATED RINGERS IV SOLN: 500.0000 mL | INTRAVENOUS | Status: DC | PRN

## 2021-09-23 MED ORDER — OXYTOCIN-SODIUM CHLORIDE 30-0.9 UT/500ML-% IV SOLN
1.0000 m[IU]/min | INTRAVENOUS | Status: DC
  Administered 2021-09-23: 2 m[IU]/min via INTRAVENOUS
  Filled 2021-09-23: qty 500

## 2021-09-23 MED ORDER — OXYCODONE-ACETAMINOPHEN 5-325 MG PO TABS: 1.0000 | ORAL_TABLET | ORAL | Status: DC | PRN

## 2021-09-23 NOTE — H&P (Signed)
Melissa Ashley is a 26 y.o. female, G2 P1001, EGA 39+ weeks with EDC 3-25 presenting for elective induction.  [place] complicated only by evaluations for palpitations and possible thyrotoxicosis. ? ?OB History   ? ? Gravida  ?2  ? Para  ?1  ? Term  ?1  ? Preterm  ?   ? AB  ?   ? Living  ?1  ?  ? ? SAB  ?   ? IAB  ?   ? Ectopic  ?   ? Multiple  ?0  ? Live Births  ?1  ?   ?  ?  ? ?Past Medical History:  ?Diagnosis Date  ? GERD (gastroesophageal reflux disease)   ? Hyperthyroidism   ? Scoliosis   ? pt has xray pic from few years ago  ? ?Past Surgical History:  ?Procedure Laterality Date  ? BREAST BIOPSY Left 04/23/2019  ? benign- ruptured cyst and density  ? TONSILLECTOMY  2004  ? hemorrhaged  ? ?Family History: family history includes Breast cancer (age of onset: 45) in her paternal grandmother; COPD in her maternal grandmother; Diabetes in her maternal grandfather; Drug abuse in her maternal grandmother; Heart disease in her maternal grandmother and paternal grandfather; Hyperlipidemia in her sister; Hypertension in her father and sister; Intellectual disability in her brother and sister; Kidney disease in her paternal grandfather; Learning disabilities in her brother and sister; Miscarriages / Stillbirths in her mother; Prostate cancer in her paternal grandfather; Uterine cancer in her maternal grandmother. ?Social History:  reports that she has never smoked. She has never used smokeless tobacco. She reports that she does not drink alcohol and does not use drugs. ? ? ?  ?Maternal Diabetes: No ?Genetic Screening: Normal ?Maternal Ultrasounds/Referrals: Normal ?Fetal Ultrasounds or other Referrals:  None ?Maternal Substance Abuse:  No ?Significant Maternal Medications:  None ?Significant Maternal Lab Results:  Group B Strep negative ?Other Comments:  None ? ?Review of Systems  ?Respiratory: Negative.    ?Cardiovascular: Negative.   ?Maternal Medical History:  ?Fetal activity: Perceived fetal activity is normal.    ?Prenatal Complications - Diabetes: none. ? ?Dilation: 1.5 ?Effacement (%): Thick ?Station: -3 ?Exam by:: J.Cox, RN ?Blood pressure 116/75, pulse 92, temperature 98.3 ?F (36.8 ?C), resp. rate 18, height '5\' 4"'$  (1.626 m), weight 88.1 kg. ?Maternal Exam:  ?Uterine Assessment: Contraction strength is mild.  Contraction frequency is irregular.  ?Abdomen: Patient reports no abdominal tenderness. Estimated fetal weight is 7.5 lbs.   ?Fetal presentation: vertex ?Introitus: Normal vulva. Normal vagina.  Amniotic fluid character: not assessed. ?Pelvis: adequate for delivery.   ? ? ?Fetal Exam ?Fetal Monitor Review: Mode: ultrasound.   ?Baseline rate: 140.  ?Variability: moderate (6-25 bpm).   ?Pattern: accelerations present and no decelerations.   ?Fetal State Assessment: Category I - tracings are normal. ? ?Physical Exam ?Vitals reviewed.  ?Constitutional:   ?   Appearance: Normal appearance.  ?Cardiovascular:  ?   Rate and Rhythm: Normal rate and regular rhythm.  ?Pulmonary:  ?   Effort: Pulmonary effort is normal. No respiratory distress.  ?Abdominal:  ?   Palpations: Abdomen is soft.  ?Genitourinary: ?   General: Normal vulva.  ?Neurological:  ?   Mental Status: She is alert.  ?  ?Prenatal labs: ?ABO, Rh: A/Positive/-- (08/23 0000) ?Antibody: Negative (08/23 0000) ?Rubella: Immune (08/23 0000) ?RPR: Nonreactive (08/23 0000)  ?HBsAg: Negative (08/23 0000)  ?HIV: Non-reactive (08/23 0000)  ?GBS: Negative/-- (02/27 0000)  ? ?Assessment/Plan: ?IUP at 39+ weeks  for elective induction.  Admitted and pitocin started, will monitor progress, anticipate SVD.  Had 900cc EBL with first delivery, will give prophylactic TXA after this delivery  ? ?Blane Ohara Jazlynne Milliner ?09/23/2021, 9:04 AM ? ? ? ? ?

## 2021-09-23 NOTE — Anesthesia Procedure Notes (Signed)
Epidural ?Patient location during procedure: OB ?Start time: 09/23/2021 12:16 PM ?End time: 09/23/2021 12:23 PM ? ?Staffing ?Anesthesiologist: Santa Lighter, MD ?Performed: anesthesiologist  ? ?Preanesthetic Checklist ?Completed: patient identified, IV checked, risks and benefits discussed, monitors and equipment checked, pre-op evaluation and timeout performed ? ?Epidural ?Patient position: sitting ?Prep: DuraPrep ?Patient monitoring: blood pressure and continuous pulse ox ?Approach: midline ?Location: L3-L4 ?Injection technique: LOR air ? ?Needle:  ?Needle type: Tuohy  ?Needle gauge: 17 G ?Needle length: 9 cm ?Needle insertion depth: 6 cm ?Catheter size: 19 Gauge ?Catheter at skin depth: 11 cm ?Test dose: negative and Other (1% Lidocaine) ? ?Additional Notes ?Patient identified.  Risk benefits discussed including failed block, incomplete pain control, headache, nerve damage, paralysis, blood pressure changes, nausea, vomiting, reactions to medication both toxic or allergic, and postpartum back pain.  Patient expressed understanding and wished to proceed.  All questions were answered.  Sterile technique used throughout procedure and epidural site dressed with sterile barrier dressing. No paresthesia or other complications noted. The patient did not experience any signs of intravascular injection such as tinnitus or metallic taste in mouth nor signs of intrathecal spread such as rapid motor block. Please see nursing notes for vital signs. ?Reason for block:procedure for pain ? ? ? ?

## 2021-09-23 NOTE — Lactation Note (Signed)
This note was copied from a baby's chart. ?Lactation Consultation Note ? ?Patient Name: Girl Marishka Rentfrow ?Today's Date: 09/23/2021 ?Reason for consult: L&D Initial assessment ?Age:27 hours ?P2, term female infant. ?See mom's previous breast feeding experience in maternal data below, mom will like to latch infant without nipple shield. ?Mom knows each infant and breastfeeding experience may be different. ?Mom latched infant on her right breast using the football hold position, LC flange out infant's top lip, infant latched with depth and was still BF after 12 minutes when LC left the room. ?Mom knows to breastfeed infant according to primal cues, 8 to 12 times within 24 hours, skin to skin. ?Mom knows to call [place] on MBU  if she has any BF questions, concerns or needs further assistance with latching infant at the breast. ?[place] congratulated parents on the birth of their daughter. 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 Infant had revison due after loosing weight and was slow to gain weight was seen in [facility] clinic. ? ?Feeding ?Mother's Current Feeding Choice: Breast Milk ? ?LATCH Score ?Latch: Grasps breast easily, tongue down, lips flanged, rhythmical sucking. ([place] worked on flanging infant's top lip outward due to infant having skin of upper lip attached to gum line.) ? ?Audible Swallowing: Spontaneous and intermittent ? ?Type of Nipple: Everted at rest and after stimulation ? ?Comfort (Breast/Nipple): Soft / non-tender ? ?Hold (Positioning): Assistance needed to correctly position infant at breast and maintain latch. ? ?LATCH Score: 9 ? ? ?Lactation Tools Discussed/Used ?  ? ?Interventions ?  ? ?Discharge ?  ? ?Consult Status ?Consult Status: Follow-up from L&D ?Date:  09/24/21 ?Follow-up type: In-patient ? ? ? ?[name] ?09/23/2021, 11:10 PM ? ? ? ?

## 2021-09-23 NOTE — Anesthesia Preprocedure Evaluation (Signed)
Anesthesia Evaluation  ?Patient identified by MRN, date of birth, ID band ?Patient awake ? ? ? ?Reviewed: ?Allergy & Precautions, NPO status , Patient's Chart, lab work & pertinent test results ? ?Airway ?Mallampati: II ? ?TM Distance: >3 FB ?Neck ROM: Full ? ? ? Dental ? ?(+) Teeth Intact, Dental Advisory Given ?  ?Pulmonary ?neg pulmonary ROS,  ?  ?Pulmonary exam normal ?breath sounds clear to auscultation ? ? ? ? ? ? Cardiovascular ?negative cardio ROS ?Normal cardiovascular exam ?Rhythm:Regular Rate:Normal ? ? ?  ?Neuro/Psych ?negative neurological ROS ?   ? GI/Hepatic ?Neg liver ROS, GERD  ,  ?Endo/Other  ?Hyperthyroidism Obesity ? ? Renal/GU ?negative Renal ROS  ? ?  ?Musculoskeletal ?negative musculoskeletal ROS ?(+) +Scoliosis ?  ? Abdominal ?  ?Peds ? Hematology ?negative hematology ROS ?(+) Plt 187k   ?Anesthesia Other Findings ?Day of surgery medications reviewed with the patient. ? Reproductive/Obstetrics ?(+) Pregnancy ? ?  ? ? ? ? ? ? ? ? ? ? ? ? ? ?  ?  ? ? ? ? ? ? ? ? ?Anesthesia Physical ?Anesthesia Plan ? ?ASA: 2 ? ?Anesthesia Plan: Epidural  ? ?Post-op Pain Management:   ? ?Induction:  ? ?PONV Risk Score and Plan: 2 and Treatment may vary due to age or medical condition ? ?Airway Management Planned: Natural Airway ? ?Additional Equipment:  ? ?Intra-op Plan:  ? ?Post-operative Plan:  ? ?Informed Consent: I have reviewed the patients History and Physical, chart, labs and discussed the procedure including the risks, benefits and alternatives for the proposed anesthesia with the patient or authorized representative who has indicated his/her understanding and acceptance.  ? ? ? ?Dental advisory given ? ?Plan Discussed with:  ? ?Anesthesia Plan Comments: (Patient identified. Risks/Benefits/Options discussed with patient including but not limited to bleeding, infection, nerve damage, paralysis, failed block, incomplete pain control, headache, blood pressure changes,  nausea, vomiting, reactions to medication both or allergic, itching and postpartum back pain. Confirmed with bedside nurse the patient's most recent platelet count. Confirmed with patient that they are not currently taking any anticoagulation, have any bleeding history or any family history of bleeding disorders. Patient expressed understanding and wished to proceed. All questions were answered. )  ? ? ? ? ? ? ?Anesthesia Quick Evaluation ? ?

## 2021-09-23 NOTE — Progress Notes (Addendum)
Comfortable with epidural ?Afeb, VSS ?FHT-140, Cat I, ctx q 2-3 min ?VE-3/50/-2, vtx, AROM light meconium ? ?Continue pitocin, monitor progress, anticipate SVD ?

## 2021-09-24 LAB — BIRTH TISSUE RECOVERY COLLECTION (PLACENTA DONATION)

## 2021-09-24 MED ORDER — COCONUT OIL OIL
1.0000 "application " | TOPICAL_OIL | Status: DC | PRN
  Administered 2021-09-24: 1 via TOPICAL

## 2021-09-24 MED ORDER — ONDANSETRON HCL 4 MG PO TABS: 4.0000 mg | ORAL_TABLET | ORAL | Status: DC | PRN

## 2021-09-24 MED ORDER — BENZOCAINE-MENTHOL 20-0.5 % EX AERO
1.0000 "application " | INHALATION_SPRAY | CUTANEOUS | Status: DC | PRN
  Administered 2021-09-24: 1 via TOPICAL
  Filled 2021-09-24: qty 56

## 2021-09-24 MED ORDER — DIPHENHYDRAMINE HCL 25 MG PO CAPS: 25.0000 mg | ORAL_CAPSULE | Freq: Four times a day (QID) | ORAL | Status: DC | PRN

## 2021-09-24 MED ORDER — MEASLES, MUMPS & RUBELLA VAC IJ SOLR: 0.5000 mL | Freq: Once | INTRAMUSCULAR | Status: DC

## 2021-09-24 MED ORDER — IBUPROFEN 600 MG PO TABS
600.0000 mg | ORAL_TABLET | Freq: Four times a day (QID) | ORAL | Status: DC
  Administered 2021-09-24 – 2021-09-25 (×6): 600 mg via ORAL
  Filled 2021-09-24 (×6): qty 1

## 2021-09-24 MED ORDER — SIMETHICONE 80 MG PO CHEW: 80.0000 mg | CHEWABLE_TABLET | ORAL | Status: DC | PRN

## 2021-09-24 MED ORDER — ZOLPIDEM TARTRATE 5 MG PO TABS: 5.0000 mg | ORAL_TABLET | Freq: Every evening | ORAL | Status: DC | PRN

## 2021-09-24 MED ORDER — PRENATAL MULTIVITAMIN CH
1.0000 | ORAL_TABLET | Freq: Every day | ORAL | Status: DC
  Administered 2021-09-24 – 2021-09-25 (×2): 1 via ORAL
  Filled 2021-09-24 (×2): qty 1

## 2021-09-24 MED ORDER — TETANUS-DIPHTH-ACELL PERTUSSIS 5-2.5-18.5 LF-MCG/0.5 IM SUSY: 0.5000 mL | PREFILLED_SYRINGE | Freq: Once | INTRAMUSCULAR | Status: DC

## 2021-09-24 MED ORDER — DIBUCAINE (PERIANAL) 1 % EX OINT: 1.0000 "application " | TOPICAL_OINTMENT | CUTANEOUS | Status: DC | PRN

## 2021-09-24 MED ORDER — MAGNESIUM HYDROXIDE 400 MG/5ML PO SUSP: 30.0000 mL | ORAL | Status: DC | PRN

## 2021-09-24 MED ORDER — ONDANSETRON HCL 4 MG/2ML IJ SOLN: 4.0000 mg | INTRAMUSCULAR | Status: DC | PRN

## 2021-09-24 MED ORDER — SENNOSIDES-DOCUSATE SODIUM 8.6-50 MG PO TABS
2.0000 | ORAL_TABLET | Freq: Every day | ORAL | Status: DC
  Administered 2021-09-24 – 2021-09-25 (×2): 2 via ORAL
  Filled 2021-09-24 (×2): qty 2

## 2021-09-24 MED ORDER — OXYCODONE HCL 5 MG PO TABS: 10.0000 mg | ORAL_TABLET | ORAL | Status: DC | PRN

## 2021-09-24 MED ORDER — WITCH HAZEL-GLYCERIN EX PADS: 1.0000 "application " | MEDICATED_PAD | CUTANEOUS | Status: DC | PRN

## 2021-09-24 MED ORDER — METHYLERGONOVINE MALEATE 0.2 MG/ML IJ SOLN: 0.2000 mg | INTRAMUSCULAR | Status: DC | PRN

## 2021-09-24 MED ORDER — ACETAMINOPHEN 325 MG PO TABS
650.0000 mg | ORAL_TABLET | ORAL | Status: DC | PRN
  Administered 2021-09-24: 650 mg via ORAL
  Filled 2021-09-24: qty 2

## 2021-09-24 MED ORDER — OXYCODONE HCL 5 MG PO TABS: 5.0000 mg | ORAL_TABLET | ORAL | Status: DC | PRN

## 2021-09-24 MED ORDER — METHYLERGONOVINE MALEATE 0.2 MG PO TABS: 0.2000 mg | ORAL_TABLET | ORAL | Status: DC | PRN

## 2021-09-24 NOTE — Lactation Note (Signed)
This note was copied from a baby's chart. ?Lactation Consultation Note ? ?Patient Name: Melissa Ashley ?Today's Date: 09/24/2021 ?Reason for consult: Follow-up assessment;Maternal endocrine disorder ?Age:27 hours ? ?P2, Baby had latched x 2 in L&D and has been sleepy since. ?Mother has been spoon feeding with good flow of colostrum. ?She had difficulty latching with first child due to lip and tongue tie and saw Nonah Mattes RN [facility] outpatient. 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 Suggest she call for help as needed. ?Mom made aware of O/P services, breastfeeding support groups, community resources, and our phone # for post-discharge questions.  ? ? ?Maternal Data ?Has patient been taught Hand Expression?: Yes ?Does the patient have breastfeeding experience prior to this delivery?: Yes ?How long did the patient breastfeed?: 6 mos. ? ?Feeding ?Mother's Current Feeding Choice: Breast Milk ? ? ?Interventions ?Interventions: Education;LC Services brochure ? ?Discharge ?Pump: Personal;DEBP (Spectra and Motif) ? ?Consult Status ?Consult Status: Follow-up ?Date: 09/25/21 ?Follow-up type: In-patient ? ? ? ?Vivianne Master Boschen ?09/24/2021, 7:47 AM ? ? ? ?

## 2021-09-24 NOTE — Progress Notes (Signed)
Patient is doing well.  She is ambulating, voiding, tolerating PO.  Pain control is good.  Lochia is appropriate ? ?Vitals:  ? 09/23/21 2354 09/24/21 0058 09/24/21 0610 09/24/21 0830  ?BP:  119/67 113/73 110/70  ?Pulse:  81 74 68  ?Resp: '16 16 16 16  '$ ?Temp:  98.2 ?F (36.8 ?C) 98.1 ?F (36.7 ?C) 98 ?F (36.7 ?C)  ?TempSrc:  Oral Oral Oral  ?SpO2:  99% 98% 97%  ?Weight:      ?Height:      ? ? ?NAD ?Fundus firm ?Ext: 1+ edema bilaterally ? ?Lab Results  ?Component Value Date  ? WBC 14.7 (H) 09/23/2021  ? HGB 12.4 09/23/2021  ? HCT 37.1 09/23/2021  ? MCV 87.5 09/23/2021  ? PLT 187 09/23/2021  ? ? ?--/--/A POS (03/22 0815) ? ?A/P 26 y.o. [id number] PPD#1 s/p SVD. ?Routine care.   ?Delivered at 2200 yesterday evening, plan discharge tomorrow ? ? ?[place] ? ?

## 2021-09-24 NOTE — Anesthesia Postprocedure Evaluation (Signed)
Anesthesia Post Note ? ?Patient: Melissa Ashley ? ?Procedure(s) Performed: AN AD HOC LABOR EPIDURAL ? ?  ? ?Patient location during evaluation: Mother Baby ?Anesthesia Type: Epidural ?Level of consciousness: awake and alert and oriented ?Pain management: satisfactory to patient ?Vital Signs Assessment: post-procedure vital signs reviewed and stable ?Respiratory status: respiratory function stable ?Cardiovascular status: stable ?Postop Assessment: no headache, no backache, epidural receding, patient able to bend at knees, no signs of nausea or vomiting, adequate PO intake and able to ambulate ?Anesthetic complications: no ? ? ?No notable events documented. ? ?Last Vitals:  ?Vitals:  ? 09/24/21 0610 09/24/21 0700  ?BP: 113/73 110/77  ?Pulse: 74 68  ?Resp: 16   ?Temp: 36.7 ?C 36.7 ?C  ?SpO2: 98%   ?  ?Last Pain:  ?Vitals:  ? 09/24/21 0700  ?TempSrc: Oral  ?PainSc: 3   ? ?Pain Goal:   ? ?  ?  ?  ?  ?  ?  ?  ? ?Alvaro Aungst ? ? ? ? ?

## 2021-09-25 ENCOUNTER — Other Ambulatory Visit (HOSPITAL_COMMUNITY): Payer: Self-pay

## 2021-09-25 ENCOUNTER — Ambulatory Visit: Payer: Self-pay

## 2021-09-25 MED ORDER — IBUPROFEN 600 MG PO TABS
600.0000 mg | ORAL_TABLET | Freq: Four times a day (QID) | ORAL | 1 refills | Status: DC | PRN
  Filled 2021-09-25: qty 40, 10d supply, fill #0

## 2021-09-25 NOTE — Discharge Summary (Signed)
? ?  Postpartum Discharge Summary ? ?Date of Service updated  ? ?   ?Patient Name: Melissa Ashley ?DOB: 1996/03/13 ?MRN: [id number] ? ?Date of admission: 09/23/2021 ?Delivery date:09/23/2021  ?Delivering provider: Willis Modena, TODD  ?Date of discharge: 09/25/2021 ? ?Admitting diagnosis: Indication for care in labor or delivery [O75.9] ?Intrauterine pregnancy: [redacted]w[redacted]d    ?Secondary diagnosis:  Active Problems: ?  Indication for care in labor or delivery ? ?Additional problems: none    ?Discharge diagnosis: Term Pregnancy Delivered                                              ?Post partum procedures: n/a ?Augmentation: AROM and Pitocin ?Complications: None ? ?Hospital course: Induction of Labor With Vaginal Delivery   ?26y.o. yo G2P2002 at 314w4das admitted to the hospital 09/23/2021 for induction of labor.  Indication for induction: Favorable cervix at term.  Patient had an uncomplicated labor course as follows: ?Membrane Rupture Time/Date: 1:12 PM ,09/23/2021   ?Delivery Method:Vaginal, Spontaneous  ?Episiotomy: None  ?Lacerations:  2nd degree  ?Details of delivery can be found in separate delivery note.  Patient had a routine postpartum course. Patient is discharged home 09/25/21. ? ?Newborn Data: ?Birth date:09/23/2021  ?Birth time:9:49 PM  ?Gender:Female  ?Living status:Living  ?Apgars:8 ,9  ?Weight:3520 g  ? ?Magnesium Sulfate received: No ?BMZ received: No ? ?Physical exam  ?Vitals:  ? 09/24/21 0830 09/24/21 1319 09/24/21 2040 09/25/21 0533  ?BP: 110/70 113/71 99/66 108/71  ?Pulse: 68 78 68 64  ?Resp: '16 18 18 18  '$ ?Temp: 98 ?F (36.7 ?C) 98.1 ?F (36.7 ?C) 98.1 ?F (36.7 ?C) 97.6 ?F (36.4 ?C)  ?TempSrc: Oral Oral Oral Oral  ?SpO2: 97% 98%    ?Weight:      ?Height:      ? ?General: alert, cooperative, and no distress ?Lochia: appropriate ?Uterine Fundus: firm ?Incision: N/A ?DVT Evaluation: No evidence of DVT seen on physical exam. ?Labs: ?Lab Results  ?Component Value Date  ? WBC 14.7 (H) 09/23/2021  ? HGB 12.4  09/23/2021  ? HCT 37.1 09/23/2021  ? MCV 87.5 09/23/2021  ? PLT 187 09/23/2021  ? ? ?  Latest Ref Rng & Units 01/03/2020  ? 10:02 AM  ?CMP  ?Glucose 70 - 99 mg/dL 88    ?BUN 6 - 23 mg/dL 10    ?Creatinine 0.40 - 1.20 mg/dL 0.60    ?Sodium 135 - 145 mEq/L 137    ?Potassium 3.5 - 5.1 mEq/L 4.2    ?Chloride 96 - 112 mEq/L 102    ?CO2 19 - 32 mEq/L 22    ?Calcium 8.4 - 10.5 mg/dL 9.3    ?Total Protein 6.0 - 8.3 g/dL 7.4    ?Total Bilirubin 0.2 - 1.2 mg/dL 0.6    ?Alkaline Phos 39 - 117 U/L 72    ?AST 0 - 37 U/L 15    ?ALT 0 - 35 U/L 8    ? ?Edinburgh Score: ? ?  09/24/2021  ?  8:30 AM  ?EdFlavia Shipperostnatal Depression Scale Screening Tool  ?I have been able to laugh and see the funny side of things. 0  ?I have looked forward with enjoyment to things. 0  ?I have blamed myself unnecessarily when things went wrong. 0  ?I have been anxious or worried for no good reason. 1  ?I have felt scared  or panicky for no good reason. 0  ?Things have been getting on top of me. 0  ?I have been so unhappy that I have had difficulty sleeping. 0  ?I have felt sad or miserable. 0  ?I have been so unhappy that I have been crying. 0  ?The thought of harming myself has occurred to me. 0  ?Edinburgh Postnatal Depression Scale Total 1  ? ? ? ? ?After visit meds:  ?Allergies as of 09/25/2021   ? ?   Reactions  ? Latex   ? Neomycin Swelling  ? Shellfish Allergy Itching, Swelling  ? Sulfa Antibiotics Swelling  ? ?  ? ?  ?Medication List  ?  ? ?TAKE these medications   ? ?docusate sodium 100 MG capsule ?Commonly known as: COLACE ?Take 100 mg by mouth as needed for mild constipation. ?  ?famotidine 10 MG tablet ?Commonly known as: PEPCID ?Take 10 mg by mouth as needed for heartburn or indigestion. ?  ?ibuprofen 600 MG tablet ?Commonly known as: ADVIL ?Take 1 tablet (600 mg total) by mouth every 6 (six) hours as needed for cramping or moderate pain. ?  ?prenatal multivitamin Tabs tablet ?Take 1 tablet by mouth daily at 12 noon. ?  ? ?  ? ? ? ?Discharge  home in stable condition ?Infant Feeding: Breast ?Infant Disposition:home with mother ?Discharge instruction: per After Visit Summary and Postpartum booklet. ?Activity: Advance as tolerated. Pelvic rest for 6 weeks.  ?Diet: routine diet ?Anticipated Birth Control: Unsure ?Postpartum Appointment:6 weeks ?Additional Postpartum F/U: Postpartum Depression checkup ?Future Appointments: ?Future Appointments  ?Date Time Provider [place]  ?10/30/2021  9:40 AM Kuneff, Renee A, DO [facility]  ? ?Follow up Visit: ? Follow-up Information   ? ? [facility]. Schedule an appointment as soon as possible for a visit in 6 week(s).   ?Why: For postpartum visit ?Contact information: ?[address]  ?[address] ?[place] [address] ?[phone] ? ? ?  ?  ? ?  ?  ? ?  ? ? ? ?  ? ?09/25/2021 ?Isaiah Serge, DO ? ? ?

## 2021-09-25 NOTE — Progress Notes (Signed)
Post Partum Day 2 ?Subjective: ?up ad lib, voiding, tolerating PO, + flatus, and lochia mild - occasional "gush" as expected when breastfeeding or gets out of bed. She denies HA, CP or SOB. She is bonding well with baby. Feels ready for discharge to home today  ? ?Objective: ?Blood pressure 108/71, pulse 64, temperature 97.6 ?F (36.4 ?C), temperature source Oral, resp. rate 18, height '5\' 4"'$  (1.626 m), weight 88.1 kg, SpO2 98 %, unknown if currently breastfeeding. ? ?Physical Exam:  ?General: alert, cooperative, and no distress ?Lochia: appropriate ?Uterine Fundus: firm ?Incision: n/a ?DVT Evaluation: No evidence of DVT seen on physical exam. ? ?Recent Labs  ?  09/23/21 ?0808  ?HGB 12.4  ?HCT 37.1  ? ? ?Assessment/Plan: ?Discharge home and Breastfeeding ?Discharge instructions reviewed ?6week pp visit  ? ? LOS: 2 days  ? ?Isaiah Serge ?09/25/2021, 11:25 AM  ? ? ?

## 2021-09-25 NOTE — Lactation Note (Signed)
This note was copied from a baby's chart. ?Lactation Consultation Note ? ?Patient Name: Melissa Ashley ?Today's Date: 09/25/2021 ?Reason for consult: Follow-up assessment ?Age:27 hours ? ? Mom's milk is coming to volume. Mom reports increased breast heaviness & that infant pulls away from breast when latching. Mom hears a big gulp before infant pulls away and Mom has also observed her cough/"choke" at least once. ? ?I discussed techniques to slow flow (laid-back nursing & slowing let-down by placing a finger about an inch away from infant's nose and pressing inward and upward towards maternal shoulder).  ? ?Mom's questions were answered to her satisfaction. Mom reports that the flange size on her hand pump is appropriate for her.  ? ? ?Lactation Tools Discussed/Used ?Tools: Pump ?Breast pump type: Manual ? ?Interventions ?Interventions: Education ? ?Discharge ?Pump: Personal;Employee Pump (Mom has a [employer], a Spectra, a wearable pump, and a hand pump) ? ? ?Matthias Hughs [place] ?09/25/2021, 1:00 PM ? ? ? ?

## 2021-09-25 NOTE — Discharge Instructions (Signed)
Call office with any concerns [phone] 

## 2021-10-03 ENCOUNTER — Telehealth (HOSPITAL_COMMUNITY): Payer: Self-pay | Admitting: *Deleted

## 2021-10-03 NOTE — Telephone Encounter (Signed)
Patient voiced no questions or concerns regarding her health at this time. EPDS=0. Patient voiced no questions or concerns regarding infant at this time. Patient reports infant sleeps in a bassinet on her back. RN reviewed ABCs of safe sleep. Patient verbalized understanding. Patient informed about hospital's virtual postpartum classes and support groups - declined email information at this time. Erline Levine, RN, 10/03/21, [phone]  ?

## 2021-10-05 ENCOUNTER — Other Ambulatory Visit (HOSPITAL_COMMUNITY): Payer: Self-pay

## 2021-10-12 ENCOUNTER — Other Ambulatory Visit (HOSPITAL_COMMUNITY): Payer: Self-pay

## 2021-10-12 DIAGNOSIS — O9123 Nonpurulent mastitis associated with lactation: Secondary | ICD-10-CM | POA: Diagnosis not present

## 2021-10-12 MED ORDER — DICLOXACILLIN SODIUM 500 MG PO CAPS
500.0000 mg | ORAL_CAPSULE | Freq: Three times a day (TID) | ORAL | 0 refills | Status: AC
  Filled 2021-10-12: qty 21, 7d supply, fill #0
  Filled 2021-10-12: qty 5, 1d supply, fill #0
  Filled 2021-10-12: qty 16, 6d supply, fill #0
  Filled 2021-10-12: qty 21, 7d supply, fill #0

## 2021-10-13 ENCOUNTER — Other Ambulatory Visit (HOSPITAL_COMMUNITY): Payer: Self-pay

## 2021-10-16 ENCOUNTER — Other Ambulatory Visit (HOSPITAL_COMMUNITY): Payer: Self-pay

## 2021-10-22 DIAGNOSIS — I491 Atrial premature depolarization: Secondary | ICD-10-CM | POA: Diagnosis not present

## 2021-10-22 DIAGNOSIS — R002 Palpitations: Secondary | ICD-10-CM | POA: Diagnosis not present

## 2021-10-30 ENCOUNTER — Ambulatory Visit (INDEPENDENT_AMBULATORY_CARE_PROVIDER_SITE_OTHER): Payer: 59 | Admitting: Family Medicine

## 2021-10-30 ENCOUNTER — Encounter: Payer: Self-pay | Admitting: Family Medicine

## 2021-10-30 VITALS — BP 114/70 | HR 60 | Temp 97.3°F | Ht 64.0 in | Wt 166.0 lb

## 2021-10-30 DIAGNOSIS — Z0000 Encounter for general adult medical examination without abnormal findings: Secondary | ICD-10-CM

## 2021-10-30 DIAGNOSIS — Z13220 Encounter for screening for lipoid disorders: Secondary | ICD-10-CM

## 2021-10-30 DIAGNOSIS — E0580 Other thyrotoxicosis without thyrotoxic crisis or storm: Secondary | ICD-10-CM | POA: Diagnosis not present

## 2021-10-30 DIAGNOSIS — L989 Disorder of the skin and subcutaneous tissue, unspecified: Secondary | ICD-10-CM

## 2021-10-30 DIAGNOSIS — Z131 Encounter for screening for diabetes mellitus: Secondary | ICD-10-CM | POA: Diagnosis not present

## 2021-10-30 DIAGNOSIS — E0590 Thyrotoxicosis, unspecified without thyrotoxic crisis or storm: Secondary | ICD-10-CM | POA: Diagnosis not present

## 2021-10-30 LAB — COMPREHENSIVE METABOLIC PANEL
ALT: 20 U/L (ref 0–35)
AST: 20 U/L (ref 0–37)
Albumin: 4.3 g/dL (ref 3.5–5.2)
Alkaline Phosphatase: 85 U/L (ref 39–117)
BUN: 11 mg/dL (ref 6–23)
CO2: 27 mEq/L (ref 19–32)
Calcium: 8.9 mg/dL (ref 8.4–10.5)
Chloride: 105 mEq/L (ref 96–112)
Creatinine, Ser: 0.49 mg/dL (ref 0.40–1.20)
GFR: 129.4 mL/min (ref >60.00)
Glucose, Bld: 73 mg/dL (ref 70–99)
Potassium: 4.2 mEq/L (ref 3.5–5.1)
Sodium: 140 mEq/L (ref 135–145)
Total Bilirubin: 1 mg/dL (ref 0.2–1.2)
Total Protein: 6.6 g/dL (ref 6.0–8.3)

## 2021-10-30 LAB — LIPID PANEL
Cholesterol: 208 mg/dL — ABNORMAL HIGH (ref 0–200)
HDL: 92.3 mg/dL (ref >39.00)
LDL Cholesterol: 107 mg/dL — ABNORMAL HIGH (ref 0–99)
NonHDL: 115.64
Total CHOL/HDL Ratio: 2
Triglycerides: 44 mg/dL (ref 0.0–149.0)
VLDL: 8.8 mg/dL (ref 0.0–40.0)

## 2021-10-30 LAB — CBC
HCT: 42.2 % (ref 36.0–46.0)
Hemoglobin: 13.6 g/dL (ref 12.0–15.0)
MCHC: 32.2 g/dL (ref 30.0–36.0)
MCV: 87.4 fl (ref 78.0–100.0)
Platelets: 252 10*3/uL (ref 150.0–400.0)
RBC: 4.83 Mil/uL (ref 3.87–5.11)
RDW: 15.1 % (ref 11.5–15.5)
WBC: 7 10*3/uL (ref 4.0–10.5)

## 2021-10-30 LAB — T4, FREE: Free T4: 0.74 ng/dL (ref 0.60–1.60)

## 2021-10-30 LAB — TSH: TSH: 0.51 u[IU]/mL (ref 0.35–5.50)

## 2021-10-30 LAB — HEMOGLOBIN A1C: Hgb A1c MFr Bld: 5.5 % (ref 4.6–6.5)

## 2021-10-30 LAB — T3, FREE: T3, Free: 2.9 pg/mL (ref 2.3–4.2)

## 2021-10-30 NOTE — Progress Notes (Signed)
? ? ?Patient ID: Talbot Grumbling, female  DOB: [birth year], 26 y.o.   MRN: [id number] ?Patient Care Team  ?  Relationship Specialty Notifications Start End  ?Ma Hillock, DO PCP - General Family Medicine  01/17/19   ?Cheri Fowler, MD Consulting Physician Obstetrics and Gynecology  01/17/19   ?Jacelyn Pi, MD Referring Physician Endocrinology  10/30/21   ? ? ?Chief Complaint  ?Patient presents with  ? Annual Exam  ?  Pt is fasting  ? ? ?Subjective: ?DOTTI BUSEY is a 26 y.o.  Female  present for CPE ?All past medical history, surgical history, allergies, family history, immunizations, medications and social history were updated in the electronic medical record today. ?All recent labs, ED visits and hospitalizations within the last year were reviewed. ? ?Health maintenance:  ?Colonoscopy: no fhx. Routine screen at 93 ?Mammogram: hx BC PGM at 60. Routine screen at 40 (lactating) ?Cervical cancer screening: last pap: 01/2019, completed by: gyn ?Immunizations: tdap 06/2021, Influenza 2022 (encouraged yearly), covid completed ?Infectious disease screening: HIV completed w/ preg, Hep C completed ?DEXA: routine screen ?Assistive device: none ?Oxygen UQJ:FHLK ?Patient has a Dental home. ?Hospitalizations/ED visits: reviewed ? ? ?  10/30/2021  ?  9:43 AM 01/03/2020  ?  9:22 AM 01/17/2019  ?  2:10 PM  ?Depression screen PHQ 2/9  ?Decreased Interest 0 0 0  ?Down, Depressed, Hopeless 0 0 0  ?PHQ - 2 Score 0 0 0  ? ?   ? View : No data to display.  ?  ?  ?  ? ? ?Immunization History  ?Administered Date(s) Administered  ? DTaP 10/29/1994, 12/31/1994, 03/14/1995, 09/06/1995  ? Hepatitis A 10/07/2005, 10/04/2006  ? Hepatitis B 1995/03/18, 09/30/1994, 03/14/1995  ? HiB (PRP-OMP) 10/29/1994, 12/31/1994, 03/14/1995, 09/06/1995  ? IPV 10/29/1994, 12/31/1994, 09/10/1999  ? Influenza,inj,Quad PF,6+ Mos 04/29/2021  ? MMR 09/06/1995, 09/10/1999  ? Meningococcal Conjugate 02/09/2011  ? Meningococcal Polysaccharide 10/04/2006  ?  PFIZER(Purple Top)SARS-COV-2 Vaccination 09/14/2019, 10/05/2019  ? Tdap 07/29/2015, 09/08/2018, 07/02/2021  ? Varicella 09/06/1995, 10/07/2005  ? ?Past Medical History:  ?Diagnosis Date  ? GERD (gastroesophageal reflux disease)   ? Hyperthyroidism   ? Hyperthyroidism affecting pregnancy in third trimester   ? Scoliosis   ? pt has xray pic from few years ago  ? Traumatic injury during pregnancy in third trimester 08/24/2021  ? ?Allergies  ?Allergen Reactions  ? Latex   ? Neomycin Swelling  ? Shellfish Allergy Itching and Swelling  ? Sulfa Antibiotics Swelling  ? ?Past Surgical History:  ?Procedure Laterality Date  ? BREAST BIOPSY Left 04/23/2019  ? benign- ruptured cyst and density  ? TONSILLECTOMY  2004  ? hemorrhaged  ? ?Family History  ?Problem Relation Age of Onset  ? Hypertension Father   ? COPD Maternal Grandmother   ? Drug abuse Maternal Grandmother   ? Heart disease Maternal Grandmother   ? Uterine cancer Maternal Grandmother   ? Miscarriages / [place] Mother   ? Learning disabilities Sister   ? Intellectual disability Sister   ? Learning disabilities Brother   ? Intellectual disability Brother   ? Diabetes Maternal Grandfather   ? Breast cancer Paternal Grandmother 9  ? Heart disease Paternal Grandfather   ? Kidney disease Paternal Grandfather   ? Prostate cancer Paternal Grandfather   ? Hyperlipidemia Sister   ? Hypertension Sister   ? ?Social History  ? ?Social History Narrative  ? Marital status/children/pets: married. 1 daughter Baldo Ash)  ? Education/employment: BSN-RN  ? Safety:   ?   -  Wears a bicycle helmet riding a bike: Yes  ?   -smoke alarm in the home:Yes  ?   - wears seatbelt: Yes  ?   - Feels safe in their relationships: Yes  ? ? ?Allergies as of 10/30/2021   ? ?   Reactions  ? Latex   ? Neomycin Swelling  ? Shellfish Allergy Itching, Swelling  ? Sulfa Antibiotics Swelling  ? ?  ? ?  ?Medication List  ?  ? ?  ? Accurate as of October 30, 2021 10:16 AM. If you have any questions, ask your  nurse or doctor.  ?  ?  ? ?  ? ?STOP taking these medications   ? ?docusate sodium 100 MG capsule ?Commonly known as: COLACE ?Stopped by: Howard Pouch, DO ?  ?ibuprofen 600 MG tablet ?Commonly known as: ADVIL ?Stopped by: Howard Pouch, DO ?  ?prenatal multivitamin Tabs tablet ?Stopped by: Howard Pouch, DO ?  ? ?  ? ?TAKE these medications   ? ?famotidine 10 MG tablet ?Commonly known as: PEPCID ?Take 10 mg by mouth as needed for heartburn or indigestion. ?  ? ?  ? ? ?All past medical history, surgical history, allergies, family history, immunizations andmedications were updated in the EMR today and reviewed under the history and medication portions of their EMR.    ? ?No results found. ? ?ROS ?14 pt review of systems performed and negative (unless mentioned in an HPI) ? ?Objective: ?BP 114/70   Pulse 60   Temp (!) 97.3 ?F (36.3 ?C) (Oral)   Ht '5\' 4"'  (1.626 m)   Wt 166 lb (75.3 kg)   SpO2 99%   BMI 28.49 kg/m?  ?Physical Exam ?Vitals and nursing note reviewed.  ?Constitutional:   ?   General: She is not in acute distress. ?   Appearance: Normal appearance. She is not ill-appearing or toxic-appearing.  ?HENT:  ?   Head: Normocephalic and atraumatic.  ?   Right Ear: Tympanic membrane, ear canal and external ear normal. There is no impacted cerumen.  ?   Left Ear: Tympanic membrane, ear canal and external ear normal. There is no impacted cerumen.  ?   Nose: No congestion or rhinorrhea.  ?   Mouth/Throat:  ?   Mouth: Mucous membranes are moist.  ?   Pharynx: Oropharynx is clear. No oropharyngeal exudate or posterior oropharyngeal erythema.  ?Eyes:  ?   General: No scleral icterus.    ?   Right eye: No discharge.     ?   Left eye: No discharge.  ?   Extraocular Movements: Extraocular movements intact.  ?   Conjunctiva/sclera: Conjunctivae normal.  ?   Pupils: Pupils are equal, round, and reactive to light.  ?Cardiovascular:  ?   Rate and Rhythm: Normal rate and regular rhythm.  ?   Pulses: Normal pulses.  ?   Heart  sounds: Normal heart sounds. No murmur heard. ?  No friction rub. No gallop.  ?Pulmonary:  ?   Effort: Pulmonary effort is normal. No respiratory distress.  ?   Breath sounds: Normal breath sounds. No stridor. No wheezing, rhonchi or rales.  ?Chest:  ?   Chest wall: No tenderness.  ?Abdominal:  ?   General: Abdomen is flat. Bowel sounds are normal. There is no distension.  ?   Palpations: Abdomen is soft. There is no mass.  ?   Tenderness: There is no abdominal tenderness. There is no right CVA tenderness, left CVA tenderness, guarding or  rebound.  ?   Hernia: No hernia is present.  ?Musculoskeletal:     ?   General: No swelling, tenderness or deformity. Normal range of motion.  ?   Cervical back: Normal range of motion and neck supple. No rigidity or tenderness.  ?   Right lower leg: No edema.  ?   Left lower leg: No edema.  ?   Comments: Left 3rd toenail with hyperpigmented lesion at proximal nailbed.   ?Lymphadenopathy:  ?   Cervical: No cervical adenopathy.  ?Skin: ?   General: Skin is warm and dry.  ?   Coloration: Skin is not jaundiced or pale.  ?   Findings: No bruising, erythema, lesion or rash.  ?Neurological:  ?   General: No focal deficit present.  ?   Mental Status: She is alert and oriented to person, place, and time. Mental status is at baseline.  ?   Cranial Nerves: No cranial nerve deficit.  ?   Sensory: No sensory deficit.  ?   Motor: No weakness.  ?   Coordination: Coordination normal.  ?   Gait: Gait normal.  ?   Deep Tendon Reflexes: Reflexes normal.  ?Psychiatric:     ?   Mood and Affect: Mood normal.     ?   Behavior: Behavior normal.     ?   Thought Content: Thought content normal.     ?   Judgment: Judgment normal.  ?  ?No results found. ? ?Assessment/plan: ?TAEJA DEBELLIS is a 26 y.o. 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 Chalmers Cater (endo) ?- CBC ?- Comprehensive metabolic panel ?- TSH ?- T3, free. T4  free ?Skin lesion: ?Referral to derm placed.  ?Discussed risk of melanoma vs small hematoma from unkn injury> would not know for certainty without bx or at least until growth of nail.  ?- no fhx or personal hx of melanoma.  ?- n

## 2021-10-30 NOTE — Patient Instructions (Signed)
No follow-ups on file.        Great to see you today.  I have refilled the medication(s) we provide.   If labs were collected, we will inform you of lab results once received either by echart message or telephone call.   - echart message- for normal results that have been seen by the patient already.   - telephone call: abnormal results or if patient has not viewed results in their echart.  Health Maintenance, Female Adopting a healthy lifestyle and getting preventive care are important in promoting health and wellness. Ask your health care provider about: The right schedule for you to have regular tests and exams. Things you can do on your own to prevent diseases and keep yourself healthy. What should I know about diet, weight, and exercise? Eat a healthy diet  Eat a diet that includes plenty of vegetables, fruits, low-fat dairy products, and lean protein. Do not eat a lot of foods that are high in solid fats, added sugars, or sodium. Maintain a healthy weight Body mass index (BMI) is used to identify weight problems. It estimates body fat based on height and weight. Your health care provider can help determine your BMI and help you achieve or maintain a healthy weight. Get regular exercise Get regular exercise. This is one of the most important things you can do for your health. Most adults should: Exercise for at least 150 minutes each week. The exercise should increase your heart rate and make you sweat (moderate-intensity exercise). Do strengthening exercises at least twice a week. This is in addition to the moderate-intensity exercise. Spend less time sitting. Even light physical activity can be beneficial. Watch cholesterol and blood lipids Have your blood tested for lipids and cholesterol at 26 years of age, then have this test every 5 years. Have your cholesterol levels checked more often if: Your lipid or cholesterol levels are high. You are older than 27 years of  age. You are at high risk for heart disease. What should I know about cancer screening? Depending on your health history and family history, you may need to have cancer screening at various ages. This may include screening for: Breast cancer. Cervical cancer. Colorectal cancer. Skin cancer. Lung cancer. What should I know about heart disease, diabetes, and high blood pressure? Blood pressure and heart disease High blood pressure causes heart disease and increases the risk of stroke. This is more likely to develop in people who have high blood pressure readings or are overweight. Have your blood pressure checked: Every 3-5 years if you are 18-39 years of age. Every year if you are 40 years old or older. Diabetes Have regular diabetes screenings. This checks your fasting blood sugar level. Have the screening done: Once every three years after age 40 if you are at a normal weight and have a low risk for diabetes. More often and at a younger age if you are overweight or have a high risk for diabetes. What should I know about preventing infection? Hepatitis B If you have a higher risk for hepatitis B, you should be screened for this virus. Talk with your health care provider to find out if you are at risk for hepatitis B infection. Hepatitis C Testing is recommended for: Everyone born from 1945 through 1965. Anyone with known risk factors for hepatitis C. Sexually transmitted infections (STIs) Get screened for STIs, including gonorrhea and chlamydia, if: You are sexually active and are younger than 27 years of age. You are   older than 27 years of age and your health care provider tells you that you are at risk for this type of infection. Your sexual activity has changed since you were last screened, and you are at increased risk for chlamydia or gonorrhea. Ask your health care provider if you are at risk. Ask your health care provider about whether you are at high risk for HIV. Your health  care provider may recommend a prescription medicine to help prevent HIV infection. If you choose to take medicine to prevent HIV, you should first get tested for HIV. You should then be tested every 3 months for as long as you are taking the medicine. Pregnancy If you are about to stop having your period (premenopausal) and you may become pregnant, seek counseling before you get pregnant. Take 400 to 800 micrograms (mcg) of folic acid every day if you become pregnant. Ask for birth control (contraception) if you want to prevent pregnancy. Osteoporosis and menopause Osteoporosis is a disease in which the bones lose minerals and strength with aging. This can result in bone fractures. If you are 65 years old or older, or if you are at risk for osteoporosis and fractures, ask your health care provider if you should: Be screened for bone loss. Take a calcium or vitamin D supplement to lower your risk of fractures. Be given hormone replacement therapy (HRT) to treat symptoms of menopause. Follow these instructions at home: Alcohol use Do not drink alcohol if: Your health care provider tells you not to drink. You are pregnant, may be pregnant, or are planning to become pregnant. If you drink alcohol: Limit how much you have to: 0-1 drink a day. Know how much alcohol is in your drink. In the [place] one drink equals one 12 oz bottle of beer (355 mL), one 5 oz glass of wine (148 mL), or one 1 oz glass of hard liquor (44 mL). Lifestyle Do not use any products that contain nicotine or tobacco. These products include cigarettes, chewing tobacco, and vaping devices, such as e-cigarettes. If you need help quitting, ask your health care provider. Do not use street drugs. Do not share needles. Ask your health care provider for help if you need support or information about quitting drugs. General instructions Schedule regular health, dental, and eye exams. Stay current with your vaccines. Tell your health  care provider if: You often feel depressed. You have ever been abused or do not feel safe at home. Summary Adopting a healthy lifestyle and getting preventive care are important in promoting health and wellness. Follow your health care provider's instructions about healthy diet, exercising, and getting tested or screened for diseases. Follow your health care provider's instructions on monitoring your cholesterol and blood pressure. This information is not intended to replace advice given to you by your health care provider. Make sure you discuss any questions you have with your health care provider. Document Revised: 11/10/2020 Document Reviewed: 11/10/2020 Elsevier Patient Education  2023 Elsevier Inc[employer]  

## 2021-11-02 ENCOUNTER — Telehealth: Payer: Self-pay | Admitting: Family Medicine

## 2021-11-02 NOTE — Telephone Encounter (Signed)
Spoke with pt regarding labs and instructions. Results faxed.  ?

## 2021-11-02 NOTE — Telephone Encounter (Signed)
Please inform patient: ?Her liver, kidney function and thyroid function are all in normal range. ?Cholesterol panel is at goal for her.  With an LDL of 107, that is excellent. ?Blood cell counts are in range, no signs of infection or anemia. ?Diabetes screening is normal with an A1c of 5.5 and a fasting glucose is 73. ? ? ?These fax over thyroid panel to her endocrine Dr. Chalmers Cater and her OB. ?If she asks:TSH 0.51, T3 free is 2.9 and T4 free 0.74 ?

## 2021-11-06 DIAGNOSIS — Z1151 Encounter for screening for human papillomavirus (HPV): Secondary | ICD-10-CM | POA: Diagnosis not present

## 2021-11-06 DIAGNOSIS — Z124 Encounter for screening for malignant neoplasm of cervix: Secondary | ICD-10-CM | POA: Diagnosis not present

## 2021-11-06 LAB — HM PAP SMEAR

## 2022-01-02 ENCOUNTER — Telehealth: Payer: 59 | Admitting: Nurse Practitioner

## 2022-01-02 DIAGNOSIS — H10021 Other mucopurulent conjunctivitis, right eye: Secondary | ICD-10-CM | POA: Diagnosis not present

## 2022-01-02 MED ORDER — OFLOXACIN 0.3 % OP SOLN
1.0000 [drp] | Freq: Four times a day (QID) | OPHTHALMIC | 0 refills | Status: AC
Start: 2022-01-02 — End: 2022-01-07

## 2022-01-02 NOTE — Progress Notes (Signed)
E-Visit for Melissa Ashley   We are sorry that you are not feeling well.  Here is how we plan to help!  Based on what you have shared with me it looks like you have conjunctivitis.  Conjunctivitis is a common inflammatory or infectious condition of the eye that is often referred to as "pink eye".  In most cases it is contagious (viral or bacterial). However, not all conjunctivitis requires antibiotics (ex. Allergic).  We have made appropriate suggestions for you based upon your presentation.  I have prescribed Oflaxacin 1-2 drops 4 times a day times 5 days   Pink eye can be highly contagious.  It is typically spread through direct contact with secretions, or contaminated objects or surfaces that one may have touched.  Strict handwashing is suggested with soap and water is urged.  If not available, use alcohol based had sanitizer.  Avoid unnecessary touching of the eye.  If you wear contact lenses, you will need to refrain from wearing them until you see no white discharge from the eye for at least 24 hours after being on medication.  You should see symptom improvement in 1-2 days after starting the medication regimen.  Call us if symptoms are not improved in 1-2 days.  Home Care: Wash your hands often! Do not wear your contacts until you complete your treatment plan. Avoid sharing towels, bed linen, personal items with a person who has pink eye. See attention for anyone in your home with similar symptoms.  Get Help Right Away If: Your symptoms do not improve. You develop blurred or loss of vision. Your symptoms worsen (increased discharge, pain or redness)   Thank you for choosing an e-visit.  Your e-visit answers were reviewed by a board certified advanced clinical practitioner to complete your personal care plan. Depending upon the condition, your plan could have included both over the counter or prescription medications.  Please review your pharmacy choice. Make sure the pharmacy is open so  you can pick up prescription now. If there is a problem, you may contact your provider through [employer] and have the prescription routed to another pharmacy.  Your safety is important to [place]. If you have drug allergies check your prescription carefully.   For the next 24 hours you can use MyChart to ask questions about today's visit, request a non-urgent call back, or ask for a work or school excuse. You will get an email in the next two days asking about your experience. I hope that your e-visit has been valuable and will speed your recovery.  I spent approximately 5 minutes reviewing the patient's history, current symptoms and coordinating their plan of care today.    Meds ordered this encounter  Medications   ofloxacin (OCUFLOX) 0.3 % ophthalmic solution    Sig: Place 1 drop into the right eye 4 (four) times daily for 5 days.    Dispense:  1 mL    Refill:  0

## 2022-01-13 DIAGNOSIS — D225 Melanocytic nevi of trunk: Secondary | ICD-10-CM | POA: Diagnosis not present

## 2022-01-13 DIAGNOSIS — D2239 Melanocytic nevi of other parts of face: Secondary | ICD-10-CM | POA: Diagnosis not present

## 2022-01-13 DIAGNOSIS — S90122A Contusion of left lesser toe(s) without damage to nail, initial encounter: Secondary | ICD-10-CM | POA: Diagnosis not present

## 2022-07-28 ENCOUNTER — Other Ambulatory Visit (HOSPITAL_COMMUNITY): Payer: Self-pay

## 2022-07-28 MED ORDER — CHLORHEXIDINE GLUCONATE 0.12 % MT SOLN
Freq: Two times a day (BID) | OROMUCOSAL | 0 refills | Status: DC
  Filled 2022-07-28 – 2022-08-11 (×2): qty 473, 16d supply, fill #0

## 2022-08-05 ENCOUNTER — Other Ambulatory Visit (HOSPITAL_COMMUNITY): Payer: Self-pay

## 2022-08-11 ENCOUNTER — Other Ambulatory Visit (HOSPITAL_COMMUNITY): Payer: Self-pay

## 2022-08-27 ENCOUNTER — Other Ambulatory Visit (HOSPITAL_COMMUNITY): Payer: Self-pay

## 2022-08-27 MED ORDER — DEXAMETHASONE 4 MG PO TABS
4.0000 mg | ORAL_TABLET | Freq: Three times a day (TID) | ORAL | 0 refills | Status: DC | PRN
  Filled 2022-08-27: qty 9, 3d supply, fill #0

## 2022-08-27 MED ORDER — IBUPROFEN 400 MG PO TABS: 400.0000 mg | ORAL_TABLET | ORAL | 0 refills | Status: DC | PRN

## 2022-08-27 MED ORDER — CHLORHEXIDINE GLUCONATE 0.12 % MT SOLN
Freq: Two times a day (BID) | OROMUCOSAL | 0 refills | Status: DC
  Filled 2022-08-27: qty 473, 16d supply, fill #0

## 2022-08-27 MED ORDER — ACETAMINOPHEN 500 MG PO TABS: 1000.0000 mg | ORAL_TABLET | Freq: Four times a day (QID) | ORAL | 0 refills | Status: DC | PRN

## 2022-08-27 MED ORDER — AMOXICILLIN 500 MG PO CAPS
500.0000 mg | ORAL_CAPSULE | Freq: Three times a day (TID) | ORAL | 0 refills | Status: DC
  Filled 2022-08-27: qty 21, 7d supply, fill #0

## 2022-12-02 ENCOUNTER — Telehealth: Payer: Commercial Managed Care - PPO | Admitting: Family Medicine

## 2022-12-02 ENCOUNTER — Other Ambulatory Visit (HOSPITAL_COMMUNITY): Payer: Self-pay

## 2022-12-02 DIAGNOSIS — J019 Acute sinusitis, unspecified: Secondary | ICD-10-CM | POA: Diagnosis not present

## 2022-12-02 DIAGNOSIS — B9689 Other specified bacterial agents as the cause of diseases classified elsewhere: Secondary | ICD-10-CM | POA: Diagnosis not present

## 2022-12-02 MED ORDER — AMOXICILLIN-POT CLAVULANATE 875-125 MG PO TABS
1.0000 | ORAL_TABLET | Freq: Two times a day (BID) | ORAL | 0 refills | Status: AC
  Filled 2022-12-02: qty 14, 7d supply, fill #0

## 2022-12-02 NOTE — Progress Notes (Signed)

## 2023-01-12 DIAGNOSIS — D485 Neoplasm of uncertain behavior of skin: Secondary | ICD-10-CM | POA: Diagnosis not present

## 2023-01-12 DIAGNOSIS — D2262 Melanocytic nevi of left upper limb, including shoulder: Secondary | ICD-10-CM | POA: Diagnosis not present

## 2023-01-12 DIAGNOSIS — L814 Other melanin hyperpigmentation: Secondary | ICD-10-CM | POA: Diagnosis not present

## 2023-01-12 DIAGNOSIS — D225 Melanocytic nevi of trunk: Secondary | ICD-10-CM | POA: Diagnosis not present

## 2023-01-17 DIAGNOSIS — Z1389 Encounter for screening for other disorder: Secondary | ICD-10-CM | POA: Diagnosis not present

## 2023-01-17 DIAGNOSIS — Z130 Encounter for screening for diseases of the blood and blood-forming organs and certain disorders involving the immune mechanism: Secondary | ICD-10-CM | POA: Diagnosis not present

## 2023-01-17 DIAGNOSIS — Z01419 Encounter for gynecological examination (general) (routine) without abnormal findings: Secondary | ICD-10-CM | POA: Diagnosis not present

## 2023-01-17 DIAGNOSIS — Z6829 Body mass index (BMI) 29.0-29.9, adult: Secondary | ICD-10-CM | POA: Diagnosis not present

## 2023-01-31 ENCOUNTER — Encounter: Payer: Commercial Managed Care - PPO | Admitting: Family Medicine

## 2023-02-08 ENCOUNTER — Telehealth: Payer: Commercial Managed Care - PPO | Admitting: Physician Assistant

## 2023-02-08 ENCOUNTER — Other Ambulatory Visit (HOSPITAL_COMMUNITY): Payer: Self-pay

## 2023-02-08 DIAGNOSIS — H109 Unspecified conjunctivitis: Secondary | ICD-10-CM | POA: Diagnosis not present

## 2023-02-08 MED ORDER — OFLOXACIN 0.3 % OP SOLN
1.0000 [drp] | Freq: Four times a day (QID) | OPHTHALMIC | 0 refills | Status: DC
  Filled 2023-02-08: qty 5, 10d supply, fill #0

## 2023-02-08 NOTE — Progress Notes (Signed)
I have spent 5 minutes in review of e-visit questionnaire, review and updating patient chart, medical decision making and response to patient.   Shayan Bramhall Cody Maxwel [place], PA-C    

## 2023-02-08 NOTE — Progress Notes (Signed)

## 2023-02-21 ENCOUNTER — Ambulatory Visit (INDEPENDENT_AMBULATORY_CARE_PROVIDER_SITE_OTHER): Payer: Self-pay | Admitting: Family Medicine

## 2023-02-21 DIAGNOSIS — Z91199 Patient's noncompliance with other medical treatment and regimen due to unspecified reason: Secondary | ICD-10-CM

## 2023-02-21 NOTE — Progress Notes (Signed)
No show cpe °

## 2023-02-21 NOTE — Patient Instructions (Signed)

## 2024-01-18 DIAGNOSIS — Z130 Encounter for screening for diseases of the blood and blood-forming organs and certain disorders involving the immune mechanism: Secondary | ICD-10-CM | POA: Diagnosis not present

## 2024-01-18 DIAGNOSIS — L538 Other specified erythematous conditions: Secondary | ICD-10-CM | POA: Diagnosis not present

## 2024-01-18 DIAGNOSIS — Z1389 Encounter for screening for other disorder: Secondary | ICD-10-CM | POA: Diagnosis not present

## 2024-01-18 DIAGNOSIS — Z01419 Encounter for gynecological examination (general) (routine) without abnormal findings: Secondary | ICD-10-CM | POA: Diagnosis not present

## 2024-01-18 DIAGNOSIS — L918 Other hypertrophic disorders of the skin: Secondary | ICD-10-CM | POA: Diagnosis not present

## 2024-01-18 DIAGNOSIS — R8290 Unspecified abnormal findings in urine: Secondary | ICD-10-CM | POA: Diagnosis not present

## 2024-01-18 DIAGNOSIS — L910 Hypertrophic scar: Secondary | ICD-10-CM | POA: Diagnosis not present

## 2024-01-18 DIAGNOSIS — Z789 Other specified health status: Secondary | ICD-10-CM | POA: Diagnosis not present

## 2024-01-18 DIAGNOSIS — L2989 Other pruritus: Secondary | ICD-10-CM | POA: Diagnosis not present

## 2024-01-18 DIAGNOSIS — L814 Other melanin hyperpigmentation: Secondary | ICD-10-CM | POA: Diagnosis not present

## 2024-01-18 DIAGNOSIS — Z6831 Body mass index (BMI) 31.0-31.9, adult: Secondary | ICD-10-CM | POA: Diagnosis not present

## 2024-01-18 DIAGNOSIS — D2372 Other benign neoplasm of skin of left lower limb, including hip: Secondary | ICD-10-CM | POA: Diagnosis not present

## 2024-02-29 ENCOUNTER — Ambulatory Visit (INDEPENDENT_AMBULATORY_CARE_PROVIDER_SITE_OTHER): Admitting: Family Medicine

## 2024-02-29 VITALS — BP 110/80 | HR 71 | Temp 97.5°F | Ht 63.78 in | Wt 180.4 lb

## 2024-02-29 DIAGNOSIS — Z0000 Encounter for general adult medical examination without abnormal findings: Secondary | ICD-10-CM | POA: Diagnosis not present

## 2024-02-29 DIAGNOSIS — M419 Scoliosis, unspecified: Secondary | ICD-10-CM | POA: Diagnosis not present

## 2024-02-29 DIAGNOSIS — Z131 Encounter for screening for diabetes mellitus: Secondary | ICD-10-CM

## 2024-02-29 DIAGNOSIS — Z13220 Encounter for screening for lipoid disorders: Secondary | ICD-10-CM | POA: Diagnosis not present

## 2024-02-29 LAB — COMPREHENSIVE METABOLIC PANEL WITH GFR
ALT: 8 U/L (ref 0–35)
AST: 11 U/L (ref 0–37)
Albumin: 4.8 g/dL (ref 3.5–5.2)
Alkaline Phosphatase: 54 U/L (ref 39–117)
BUN: 12 mg/dL (ref 6–23)
CO2: 26 meq/L (ref 19–32)
Calcium: 9.3 mg/dL (ref 8.4–10.5)
Chloride: 104 meq/L (ref 96–112)
Creatinine, Ser: 0.47 mg/dL (ref 0.40–1.20)
GFR: 128.58 mL/min (ref >60.00)
Glucose, Bld: 87 mg/dL (ref 70–99)
Potassium: 4.3 meq/L (ref 3.5–5.1)
Sodium: 141 meq/L (ref 135–145)
Total Bilirubin: 0.7 mg/dL (ref 0.2–1.2)
Total Protein: 7.2 g/dL (ref 6.0–8.3)

## 2024-02-29 LAB — LIPID PANEL
Cholesterol: 173 mg/dL (ref 0–200)
HDL: 75.6 mg/dL (ref >39.00)
LDL Cholesterol: 85 mg/dL (ref 0–99)
NonHDL: 97.59
Total CHOL/HDL Ratio: 2
Triglycerides: 65 mg/dL (ref 0.0–149.0)
VLDL: 13 mg/dL (ref 0.0–40.0)

## 2024-02-29 LAB — TSH: TSH: 0.81 u[IU]/mL (ref 0.35–5.50)

## 2024-02-29 LAB — CBC
HCT: 41.6 % (ref 36.0–46.0)
Hemoglobin: 13.8 g/dL (ref 12.0–15.0)
MCHC: 33.1 g/dL (ref 30.0–36.0)
MCV: 85.1 fl (ref 78.0–100.0)
Platelets: 264 K/uL (ref 150.0–400.0)
RBC: 4.89 Mil/uL (ref 3.87–5.11)
RDW: 14.2 % (ref 11.5–15.5)
WBC: 7.9 K/uL (ref 4.0–10.5)

## 2024-02-29 LAB — HEMOGLOBIN A1C: Hgb A1c MFr Bld: 5.5 % (ref 4.6–6.5)

## 2024-02-29 NOTE — Progress Notes (Signed)
 Patient ID: Melissa Ashley, female  DOB: Nov 19, 1995, 28 y.o.   MRN: [id number] Patient Care Team    Relationship Specialty Notifications Start End  Catherine Charlies LABOR, DO PCP - General Family Medicine  01/17/19   Horacio Boas, MD Consulting Physician Obstetrics and Gynecology  01/17/19   Tommas Pears, MD Referring Physician Endocrinology  10/30/21     Chief Complaint  Patient presents with   Annual Exam    Pt not fasting    Subjective: Melissa Ashley is a 28 y.o.  Female  present for CPE All past medical history, surgical history, allergies, family history, immunizations, medications and social history were updated in the electronic medical record today. All recent labs, ED visits and hospitalizations within the last year were reviewed.  Health maintenance:  Colonoscopy: no fhx. Routine screen at 44 Mammogram: hx BC PGM at 60. Routine screen at 39 Cervical cancer screening: last pap: 11/06/2021, completed by: gyn Immunizations: tdap 06/2021, Influenza 2022 (encouraged yearly), covid completed Infectious disease screening: HIV completed w/ preg, Hep C completed DEXA: routine screen Assistive device: none Oxygen [id number] Patient has a Dental home. Hospitalizations/ED visits: reviewed     02/29/2024    1:33 PM 10/30/2021    9:43 AM 01/03/2020    9:22 AM 01/17/2019    2:10 PM  Depression screen PHQ 2/9  Decreased Interest 0 0 0 0  Down, Depressed, Hopeless 0 0 0 0  PHQ - 2 Score 0 0 0 0      02/29/2024    1:33 PM  GAD 7 : Generalized Anxiety Score  Nervous, Anxious, on Edge 1  Control/stop worrying 1  Worry too much - different things 1  Trouble relaxing 1  Restless 0  Easily annoyed or irritable 0  Afraid - awful might happen 0  Total GAD 7 Score 4  Anxiety Difficulty Not difficult at all    Immunization History  Administered Date(s) Administered   DTaP 10/29/1994, 12/31/1994, 03/14/1995, 09/06/1995   HIB (PRP-OMP) 10/29/1994, 12/31/1994, 03/14/1995, 09/06/1995    Hepatitis A 10/07/2005, 10/04/2006   Hepatitis B 06/26/95, 09/30/1994, 03/14/1995   IPV 10/29/1994, 12/31/1994, 09/10/1999   Influenza,inj,Quad PF,6+ Mos 04/29/2021   MMR 09/06/1995, 09/10/1999   Meningococcal Conjugate 02/09/2011   Meningococcal polysaccharide vaccine (MPSV4) 10/04/2006   PFIZER(Purple Top)SARS-COV-2 Vaccination 09/14/2019, 10/05/2019   Tdap 07/29/2015, 09/08/2018, 07/02/2021   Varicella 09/06/1995, 10/07/2005   Past Medical History:  Diagnosis Date   GERD (gastroesophageal reflux disease)    Hyperthyroidism    Hyperthyroidism affecting pregnancy in third trimester    Scoliosis    pt has xray pic from few years ago   Traumatic injury during pregnancy in third trimester 08/24/2021   Allergies  Allergen Reactions   Latex    Neomycin Swelling   Shellfish Allergy Itching and Swelling   Sulfa Antibiotics Swelling   Past Surgical History:  Procedure Laterality Date   BREAST BIOPSY Left 04/23/2019   benign- ruptured cyst and density   TONSILLECTOMY  2004   hemorrhaged   Family History  Problem Relation Age of Onset   Hypertension Father    COPD Maternal Grandmother    Drug abuse Maternal Grandmother    Heart disease Maternal Grandmother    Uterine cancer Maternal Grandmother    Miscarriages / [place] Mother    Learning disabilities Sister    Intellectual disability Sister    Learning disabilities Brother    Intellectual disability Brother    Diabetes Maternal Grandfather    Breast  cancer Paternal Grandmother 21   Heart disease Paternal Grandfather    Kidney disease Paternal Grandfather    Prostate cancer Paternal Grandfather    Hyperlipidemia Sister    Hypertension Sister    Social History   Social History Narrative   Marital status/children/pets: married. 1 daughter Rosslyn)   Education/employment: BSN-RN   Safety:      -Wears a bicycle helmet riding a bike: Yes     -smoke alarm in the home:Yes     - wears seatbelt: Yes     -  Feels safe in their relationships: Yes    Allergies as of 02/29/2024       Reactions   Latex    Neomycin Swelling   Shellfish Allergy Itching, Swelling   Sulfa Antibiotics Swelling        Medication List    as of February 29, 2024  1:51 PM   You have not been prescribed any medications.     All past medical history, surgical history, allergies, family history, immunizations andmedications were updated in the EMR today and reviewed under the history and medication portions of their EMR.     No results found.  ROS 14 pt review of systems performed and negative (unless mentioned in an HPI)  Objective: BP 110/80   Pulse 71   Temp (!) 97.5 F (36.4 C)   Ht 5' 3.78 (1.62 m)   Wt 180 lb 6.4 oz (81.8 kg)   LMP 02/06/2024   SpO2 100%   Breastfeeding No   BMI 31.18 kg/m  Physical Exam Vitals and nursing note reviewed.  Constitutional:      General: She is not in acute distress.    Appearance: Normal appearance. She is not ill-appearing or toxic-appearing.  HENT:     Head: Normocephalic and atraumatic.     Right Ear: Tympanic membrane, ear canal and external ear normal. There is no impacted cerumen.     Left Ear: Tympanic membrane, ear canal and external ear normal. There is no impacted cerumen.     Nose: No congestion or rhinorrhea.     Mouth/Throat:     Mouth: Mucous membranes are moist.     Pharynx: Oropharynx is clear. No oropharyngeal exudate or posterior oropharyngeal erythema.  Eyes:     General: No scleral icterus.       Right eye: No discharge.        Left eye: No discharge.     Extraocular Movements: Extraocular movements intact.     Conjunctiva/sclera: Conjunctivae normal.     Pupils: Pupils are equal, round, and reactive to light.  Cardiovascular:     Rate and Rhythm: Normal rate and regular rhythm.     Pulses: Normal pulses.     Heart sounds: Normal heart sounds. No murmur heard.    No friction rub. No gallop.  Pulmonary:     Effort: Pulmonary effort  is normal. No respiratory distress.     Breath sounds: Normal breath sounds. No stridor. No wheezing, rhonchi or rales.  Chest:     Chest wall: No tenderness.  Abdominal:     General: Abdomen is flat. Bowel sounds are normal. There is no distension.     Palpations: Abdomen is soft. There is no mass.     Tenderness: There is no abdominal tenderness. There is no right CVA tenderness, left CVA tenderness, guarding or rebound.     Hernia: No hernia is present.  Musculoskeletal:        General: No  swelling, tenderness or deformity. Normal range of motion.     Cervical back: Normal range of motion and neck supple. No rigidity or tenderness.     Right lower leg: No edema.     Left lower leg: No edema.     Comments: Scoliosis present  Lymphadenopathy:     Cervical: No cervical adenopathy.  Skin:    General: Skin is warm and dry.     Coloration: Skin is not jaundiced or pale.     Findings: No bruising, erythema, lesion or rash.  Neurological:     General: No focal deficit present.     Mental Status: She is alert and oriented to person, place, and time. Mental status is at baseline.     Cranial Nerves: No cranial nerve deficit.     Sensory: No sensory deficit.     Motor: No weakness.     Coordination: Coordination normal.     Gait: Gait normal.     Deep Tendon Reflexes: Reflexes normal.  Psychiatric:        Mood and Affect: Mood normal.        Behavior: Behavior normal.        Thought Content: Thought content normal.        Judgment: Judgment normal.     No results found.  Assessment/plan: Melissa Ashley is a 28 y.o. female present for CPE  Routine general medical examination at a health care facility Patient was encouraged to exercise greater than 150 minutes a week. Patient was encouraged to choose a diet filled with fresh fruits and vegetables, and lean meats. AVS provided to patient today for education/recommendation on gender specific health and safety  maintenance. Colonoscopy: no fhx. Routine screen at 44 Mammogram: hx BC PGM at 60. Routine screen at 39 Cervical cancer screening: last pap: 11/06/2021, completed by: gyn Immunizations: tdap 06/2021, Influenza (encouraged yearly) Infectious disease screening: HIV completed w/ preg, Hep C completed DEXA: routine screen  Diabetes mellitus screening - Hemoglobin A1c Lipid screening - Lipid panel   Return in about 1 year (around 03/01/2025) for cpe (20 min).    Orders Placed This Encounter  Procedures   CBC   Comprehensive metabolic panel with GFR   Hemoglobin A1c   Lipid panel   TSH   Ambulatory referral to Orthopedic Surgery   No orders of the defined types were placed in this encounter.  Referral Orders         Ambulatory referral to Orthopedic Surgery        Electronically signed by: Charlies Bellini, DO [facility] [place]

## 2024-02-29 NOTE — Patient Instructions (Addendum)

## 2024-03-01 ENCOUNTER — Ambulatory Visit: Payer: Self-pay | Admitting: Family Medicine
